# Patient Record
Sex: Male | Born: 1975 | Race: White | Hispanic: No | Marital: Married | Smoking: Former smoker
Health system: Southern US, Community
[De-identification: ages and names within clinical notes are randomized; demographics above are authoritative.]

## PROBLEM LIST (undated history)

## (undated) DIAGNOSIS — K219 Gastro-esophageal reflux disease without esophagitis: Secondary | ICD-10-CM

## (undated) DIAGNOSIS — K469 Unspecified abdominal hernia without obstruction or gangrene: Secondary | ICD-10-CM

## (undated) HISTORY — PX: HERNIA REPAIR: SHX51

## (undated) HISTORY — PX: WISDOM TOOTH EXTRACTION: SHX21

---

## 2013-05-04 ENCOUNTER — Encounter (HOSPITAL_BASED_OUTPATIENT_CLINIC_OR_DEPARTMENT_OTHER): Payer: Self-pay

## 2013-05-04 ENCOUNTER — Emergency Department (HOSPITAL_BASED_OUTPATIENT_CLINIC_OR_DEPARTMENT_OTHER)
Admission: EM | Admit: 2013-05-04 | Discharge: 2013-05-04 | Disposition: A | Discharge status: Home / Self Care | Payer: 59 | Attending: Emergency Medicine | Admitting: Emergency Medicine

## 2013-05-04 DIAGNOSIS — Y9389 Activity, other specified: Secondary | ICD-10-CM | POA: Insufficient documentation

## 2013-05-04 DIAGNOSIS — T189XXA Foreign body of alimentary tract, part unspecified, initial encounter: Secondary | ICD-10-CM | POA: Insufficient documentation

## 2013-05-04 DIAGNOSIS — Z8719 Personal history of other diseases of the digestive system: Secondary | ICD-10-CM | POA: Insufficient documentation

## 2013-05-04 DIAGNOSIS — IMO0002 Reserved for concepts with insufficient information to code with codable children: Secondary | ICD-10-CM | POA: Insufficient documentation

## 2013-05-04 DIAGNOSIS — Y9289 Other specified places as the place of occurrence of the external cause: Secondary | ICD-10-CM | POA: Insufficient documentation

## 2013-05-04 DIAGNOSIS — R112 Nausea with vomiting, unspecified: Secondary | ICD-10-CM | POA: Insufficient documentation

## 2013-05-04 HISTORY — DX: Unspecified abdominal hernia without obstruction or gangrene: K46.9

## 2013-05-04 NOTE — ED Notes (Signed)
Pt states he is having abd "discomfort-feel like something moving around in there"-since eating a sandwich from work that had plastic in it at 745am

## 2013-05-04 NOTE — ED Provider Notes (Signed)
History     CSN: [id number]  Arrival date & time 05/04/13  2046   First MD Initiated Contact with Patient 05/04/13 2304      Chief Complaint  Patient presents with  . Abdominal Pain    (Consider location/radiation/quality/duration/timing/severity/associated sxs/prior treatment) HPI This is a 37 year old male who ate a bacon biscuit for breakfast this morning. It had some plastic adherent to the bacon, and he estimates he swallowed a sliver about 1-1/2 inches long according to light. He states he was thicker than plastic wrap. This evening after dinner he developed nausea and retching. There was also the sensation that "something was moving around" in his epigastric region;   The symptoms were mild. There was no associated abdominal distention, diarrhea or change in his chronic constipation. He is asymptomatic at this time.   Past Medical History  Diagnosis Date  . Hernia     Past Surgical History  Procedure Laterality Date  . Hernia repair      No family history on file.  History  Substance Use Topics  . Smoking status: Never Smoker   . Smokeless tobacco: Not on file  . Alcohol Use: Yes      Review of Systems  All other systems reviewed and are negative.    Allergies  Sulfa antibiotics  Home Medications   Current Outpatient Rx  Name  Route  Sig  Dispense  Refill  . PREDNISONE PO   Oral   Take by mouth.           BP 144/89  Pulse 70  Temp(Src) 98.2 F (36.8 C) (Oral)  Resp 20  Ht 6\' 1"  (1.854 m)  Wt 188 lb (85.276 kg)  BMI 24.81 kg/m2  SpO2 98%  Physical Exam General: Well-developed, well-nourished male in no acute distress; appearance consistent with age of record HENT: normocephalic, atraumatic Eyes: pupils equal round and reactive to light; extraocular muscles intact Neck: supple Heart: regular rate and rhythm Lungs: clear to auscultation bilaterally Abdomen: soft; nondistended; nontender; no masses or hepatosplenomegaly; bowel sounds  present Extremities: No deformity; full range of motion Neurologic: Awake, alert and oriented; motor function intact in all extremities and symmetric; no facial droop Skin: Warm and dry Psychiatric: Normal mood and affect    ED Course  Procedures (including critical care time)    MDM  Patient was reassured that what he swallowed is unlikely to cause any serious difficulty. He was advised to return for severe pain, intractable nausea and vomiting, fever or rigid abdomen.        Hanley Seamen, MD 05/04/13 2320

## 2013-05-04 NOTE — ED Notes (Signed)
MD at bedside. 

## 2015-10-23 ENCOUNTER — Emergency Department (HOSPITAL_BASED_OUTPATIENT_CLINIC_OR_DEPARTMENT_OTHER)
Admission: EM | Admit: 2015-10-23 | Discharge: 2015-10-23 | Disposition: A | Discharge status: Home / Self Care | Payer: 59 | Attending: Emergency Medicine | Admitting: Emergency Medicine

## 2015-10-23 ENCOUNTER — Encounter (HOSPITAL_BASED_OUTPATIENT_CLINIC_OR_DEPARTMENT_OTHER): Payer: Self-pay | Admitting: *Deleted

## 2015-10-23 ENCOUNTER — Other Ambulatory Visit: Payer: Self-pay

## 2015-10-23 DIAGNOSIS — R002 Palpitations: Secondary | ICD-10-CM | POA: Diagnosis not present

## 2015-10-23 DIAGNOSIS — R079 Chest pain, unspecified: Secondary | ICD-10-CM | POA: Insufficient documentation

## 2015-10-23 DIAGNOSIS — R000 Tachycardia, unspecified: Secondary | ICD-10-CM | POA: Diagnosis present

## 2015-10-23 DIAGNOSIS — Z87891 Personal history of nicotine dependence: Secondary | ICD-10-CM | POA: Insufficient documentation

## 2015-10-23 DIAGNOSIS — K219 Gastro-esophageal reflux disease without esophagitis: Secondary | ICD-10-CM | POA: Insufficient documentation

## 2015-10-23 DIAGNOSIS — Z79899 Other long term (current) drug therapy: Secondary | ICD-10-CM | POA: Insufficient documentation

## 2015-10-23 DIAGNOSIS — L03113 Cellulitis of right upper limb: Secondary | ICD-10-CM | POA: Insufficient documentation

## 2015-10-23 DIAGNOSIS — R42 Dizziness and giddiness: Secondary | ICD-10-CM | POA: Diagnosis not present

## 2015-10-23 HISTORY — DX: Gastro-esophageal reflux disease without esophagitis: K21.9

## 2015-10-23 LAB — COMPREHENSIVE METABOLIC PANEL
ALBUMIN: 3.7 g/dL (ref 3.5–5.0)
ALK PHOS: 42 U/L (ref 38–126)
ALT: 16 U/L — AB (ref 17–63)
AST: 19 U/L (ref 15–41)
Anion gap: 6 (ref 5–15)
BILIRUBIN TOTAL: 0.8 mg/dL (ref 0.3–1.2)
BUN: 17 mg/dL (ref 6–20)
CALCIUM: 8.9 mg/dL (ref 8.9–10.3)
CO2: 26 mmol/L (ref 22–32)
CREATININE: 0.93 mg/dL (ref 0.61–1.24)
Chloride: 107 mmol/L (ref 101–111)
GFR calc Af Amer: >60 mL/min (ref >60)
GFR calc non Af Amer: >60 mL/min (ref >60)
GLUCOSE: 90 mg/dL (ref 65–99)
POTASSIUM: 3.8 mmol/L (ref 3.5–5.1)
Sodium: 139 mmol/L (ref 135–145)
TOTAL PROTEIN: 6.8 g/dL (ref 6.5–8.1)

## 2015-10-23 LAB — CBC WITH DIFFERENTIAL/PLATELET
BASOS ABS: 0 10*3/uL (ref 0.0–0.1)
BASOS PCT: 0 %
Eosinophils Absolute: 0.2 10*3/uL (ref 0.0–0.7)
Eosinophils Relative: 3 %
HEMATOCRIT: 39.1 % (ref 39.0–52.0)
HEMOGLOBIN: 14.1 g/dL (ref 13.0–17.0)
LYMPHS PCT: 14 %
Lymphs Abs: 1 10*3/uL (ref 0.7–4.0)
MCH: 29.4 pg (ref 26.0–34.0)
MCHC: 36.1 g/dL — ABNORMAL HIGH (ref 30.0–36.0)
MCV: 81.6 fL (ref 78.0–100.0)
Monocytes Absolute: 0.8 10*3/uL (ref 0.1–1.0)
Monocytes Relative: 11 %
NEUTROS ABS: 5.3 10*3/uL (ref 1.7–7.7)
NEUTROS PCT: 72 %
Platelets: 190 10*3/uL (ref 150–400)
RBC: 4.79 MIL/uL (ref 4.22–5.81)
RDW: 12.4 % (ref 11.5–15.5)
WBC: 7.4 10*3/uL (ref 4.0–10.5)

## 2015-10-23 LAB — TSH: TSH: 0.652 u[IU]/mL (ref 0.350–4.500)

## 2015-10-23 MED ORDER — SODIUM CHLORIDE 0.9 % IV BOLUS (SEPSIS)
1000.0000 mL | Freq: Once | INTRAVENOUS | Status: AC
  Administered 2015-10-23: 1000 mL via INTRAVENOUS

## 2015-10-23 MED ORDER — CLINDAMYCIN HCL 150 MG PO CAPS: 300.0000 mg | ORAL_CAPSULE | Freq: Four times a day (QID) | ORAL | Status: DC

## 2015-10-23 MED ORDER — VANCOMYCIN HCL IN DEXTROSE 1-5 GM/200ML-% IV SOLN
1000.0000 mg | Freq: Once | INTRAVENOUS | Status: AC
  Administered 2015-10-23: 1000 mg via INTRAVENOUS
  Filled 2015-10-23: qty 200

## 2015-10-23 NOTE — ED Notes (Signed)
Patient states he developed intermittent rapid heart rate approximately two weeks ago with a high rate of 95.  States one week later, he traveled to [place] and while there, he began to have more episodes of the rapid heart rate.  For the last four days, the symptoms have been worse and are associated with chest pain with radiation into left shoulder and sob.  This morning he has had seven or eight runs of increased heart rate with a high of 90.  Was seen at Urgent Care and told he had a normal ekg.  Additionally, three days ago, he returned from [place] and eight hours later he developed redness and swelling in his right elbow which has progressively gotten worse.

## 2015-10-23 NOTE — ED Notes (Signed)
MD at bedside. 

## 2015-10-23 NOTE — ED Notes (Signed)
Pt reports HR went into 80's and he started feeling dizzy, informed MD.  Pt appears in NAD, on cell phone with wife at bedside

## 2015-10-23 NOTE — ED Provider Notes (Signed)
CSN: [id number]     Arrival date & time 10/23/15  0901 History   First MD Initiated Contact with Patient 10/23/15 0912     Chief Complaint  Patient presents with  . Tachycardia     (Consider location/radiation/quality/duration/timing/severity/associated sxs/prior Treatment) Patient is a 39 y.o. male presenting with palpitations.  Palpitations Palpitations quality:  Regular Onset quality:  Sudden Duration:  30 seconds Timing:  Intermittent Chronicity:  New Context: not anxiety, not blood loss, not caffeine, not dehydration, not exercise, not illicit drugs, not nicotine and not stimulant use   Relieved by:  None tried Worsened by:  Nothing Ineffective treatments:  None tried Associated symptoms: chest pain (once last friday)   Associated symptoms: no back pain, no leg pain and no nausea     Past Medical History  Diagnosis Date  . Hernia   . Acid reflux    Past Surgical History  Procedure Laterality Date  . Hernia repair     No family history on file. Social History  Substance Use Topics  . Smoking status: Former Smoker    Quit date: 12/07/2013  . Smokeless tobacco: Never Used  . Alcohol Use: Yes     Comment: occassionally    Review of Systems  Constitutional: Negative for fever and chills.  Cardiovascular: Positive for chest pain (once last friday) and palpitations (intermittent).  Gastrointestinal: Negative for nausea.  Genitourinary: Negative for urgency and hematuria.  Musculoskeletal: Negative for myalgias and back pain.  Skin: Positive for rash (right arm around elbow and on forearm).  Psychiatric/Behavioral: Negative for agitation.  All other systems reviewed and are negative.     Allergies  Sulfa antibiotics  Home Medications   Prior to Admission medications   Medication Sig Start Date End Date Taking? Authorizing Provider  DOXYCYCLINE PO Take by mouth. Started on 10/21/15 for elbow infection.   Yes Historical Provider, MD  esomeprazole (NEXIUM)  10 MG packet Take 10 mg by mouth daily before breakfast.   Yes Historical Provider, MD  clindamycin (CLEOCIN) 150 MG capsule Take 2 capsules (300 mg total) by mouth every 6 (six) hours. 10/23/15   Merrily Pew, MD  PREDNISONE PO Take by mouth.    Historical Provider, MD   BP 111/74 mmHg  Pulse 64  Temp(Src) 97.7 F (36.5 C) (Oral)  Resp 16  SpO2 100% Physical Exam  Constitutional: He is oriented to person, place, and time. He appears well-developed and well-nourished.  HENT:  Head: Normocephalic and atraumatic.  Eyes: Conjunctivae are normal. Pupils are equal, round, and reactive to light.  Neck: Normal range of motion.  Cardiovascular: Normal rate and regular rhythm.  Exam reveals no gallop and no friction rub.   No murmur heard. Pulmonary/Chest: Effort normal. No respiratory distress. He has no wheezes.  Abdominal: Soft. Bowel sounds are normal. He exhibits no distension.  Musculoskeletal: Normal range of motion.  Neurological: He is alert and oriented to person, place, and time.  Nursing note and vitals reviewed.   ED Course  Procedures (including critical care time) Labs Review Labs Reviewed  CBC WITH DIFFERENTIAL/PLATELET - Abnormal; Notable for the following:    MCHC 36.1 (*)    All other components within normal limits  COMPREHENSIVE METABOLIC PANEL - Abnormal; Notable for the following:    ALT 16 (*)    All other components within normal limits  TSH    Imaging Review No results found. I have personally reviewed and evaluated these images and lab results as part of my medical  decision-making.   EKG Interpretation   Date/Time:  Wednesday October 23 2015 09:08:06 EST Ventricular Rate:  68 PR Interval:  156 QRS Duration: 92 QT Interval:  396 QTC Calculation: 421 R Axis:   -15 Text Interpretation:  Normal sinus rhythm Minimal voltage criteria for  LVH, may be normal variant Borderline ECG Confirmed by Sahalie Beth MD, Corene Cornea  [phone]) on 10/23/2015 9:10:58 AM       MDM   Final diagnoses:  Palpitations  Cellulitis of right upper extremity   Intermittent palpitations of uncertain etiology associated with some lightheadedness. Also has cellulitis of his right arm. Monitored in the emergency department for prolonged period time at one episode of dizziness when his heart rate at 80 but there is no obvious dysrhythmias or tachyarrhythmias at that time. As her to cut out caffeine he will work on identifying other possible associated causes. Could also be related to anxiety to a certain extent. Switch antibiotics from doxycycline to clindamycin to better cover non-MRSA bacteria. Follow-up with primary doctor for possible Holter monitor or other workup. Will return here for any new or worsening symptoms.      Merrily Pew, MD 10/24/15 [phone]

## 2015-10-23 NOTE — Discharge Instructions (Signed)
°Emergency Department Resource Guide °1) Find a Doctor and Pay Out of Pocket °Although you won't have to find out who is covered by your insurance plan, it is a good idea to ask around and get recommendations. You will then need to call the office and see if the doctor you have chosen will accept you as a new patient and what types of options they offer for patients who are self-pay. Some doctors offer discounts or will set up payment plans for their patients who do not have insurance, but you will need to ask so you aren't surprised when you get to your appointment. ° °2) Contact Your Local Health Department °Not all health departments have doctors that can see patients for sick visits, but many do, so it is worth a call to see if yours does. If you don't know where your local health department is, you can check in your phone book. The CDC also has a tool to help you locate your state's health department, and many state websites also have listings of all of their local health departments. ° °3) Find a Walk-in Clinic °If your illness is not likely to be very severe or complicated, you may want to try a walk in clinic. These are popping up all over the country in pharmacies, drugstores, and shopping centers. They're usually staffed by nurse practitioners or physician assistants that have been trained to treat common illnesses and complaints. They're usually fairly quick and inexpensive. However, if you have serious medical issues or chronic medical problems, these are probably not your best option. ° °No Primary Care Doctor: °- Call [facility] at  832-8000 - they can help you locate a primary care doctor that  accepts your insurance, provides certain services, etc. °- Physician Referral Service- 1-800-533-3463 ° °Chronic Pain Problems: °Organization         Address  Phone   Notes  °[facility]  (336) 297-2271 Patients need to be referred by their primary care doctor.  ° °Medication  Assistance: °Organization         Address  Phone   Notes  °[place] [facility] [address] °[place], [place] [address] (336) 641-8030 --Must be a resident of [place] °-- Must have NO insurance coverage whatsoever (no Medicaid/ Medicare, etc.) °-- The pt. MUST have a primary care doctor that directs their care regularly and follows them in the community °  °[facility]  (866) 331-1348   °[employer]  (888) 892-1162   ° °Agencies that provide inexpensive medical care: °Organization         Address  Phone   Notes  °[facility]  (336) 832-8035   °[facility]    (336) 832-7272   °[facility] [address] °[place], [place] [address] (336) 832-4777   °[facility] [address], °[place] (336) 271-4999   °[facility]    (336) 373-0678   °[facility]    (336) 272-1050   °[facility] ° [address], [place] Phone:  (336) 832-4444, Fax:  (336) 832-4440 Hours of Operation:  9 am - 6 pm, M-F.  Also accepts Medicaid/Medicare and self-pay.  °[facility] ° [address], [place] Phone: (336) 832-3150, Fax: (336) 832-3151. Hours of Operation:  8:30 am - 5:30 pm, M-F.  Also accepts Medicaid and self-pay.  °[facility] [place] [address], [place] Phone: (336) 878-6027   °[facility] [address], [place], [place] (336)723-1848, Ext. 123 Mondays & Thursdays: 7-9 AM.  First 15 patients are seen on a first come, first serve basis. °  ° °Medicaid-accepting [place] Providers: ° °Organization         Address  Phone   Notes  °[facility] [address], [place] (336) 641-2100 Also accepts self-pay patients.  °[facility] [address], [place] ° (336) 856-9996   °[facility] [address], [place]  (336) 288-8857   °[facility] [address] [place] [address], [place] (336) 299-7000   °Veita Bland [address], [place]  ° (336) 373-1557 Only accepts [place] Access Medicaid patients after they have their name applied to their card.  ° °Self-Pay (no insurance) in [place]: ° °Organization         Address  Phone   Notes  °Sickle Cell Patients, [facility] [address], [place] (336) 832-1970   °[facility] [address], [place] (336) 832-4400   °[facility] ° [address], [place] (336) 992-4800   °[facility]/Dr. Osei-Bonsu ° [address] [place] [address], [place] or [address], [place] (336) 841-8500 Phone number for both [place] and [place] locations is the same.  °[facility] [address], [place] (336) 299-0000   °[facility] [address] [place] [address], [place] or [address] (336) 852-7530 °(336) 878-2260   °[facility] [address], [place] (336) 350-1642, phone; (336) 294-5005, fax Sees patients 1st and 3rd Saturday of every month.  Must not qualify for public or private insurance (i.e. Medicaid, Medicare, [place] Health Choice, Veterans' Benefits) • Household income should be no more than 200% of the poverty level •The clinic cannot treat you if you are pregnant or think you are pregnant • Sexually transmitted diseases are not treated at the clinic.  ° ° °Dental Care: °Organization         Address  Phone  Notes  °[place] [facility] [address], [place] (336) 641-6152 Accepts children up to age 21 who are enrolled in Medicaid or [place] Health Choice; pregnant women with a Medicaid card; and children who have applied for Medicaid or [place] Health Choice, but were declined, whose parents can pay a reduced fee at time of service.  °[place]  [facility] [place]  [address], [place] (336) 641-7733 Accepts children up to age 21 who are enrolled in Medicaid or [place] Health Choice; pregnant women with a Medicaid card; and children who have applied for Medicaid or [place] Health Choice, but were declined, whose parents can pay a reduced fee at time of service.  °[facility] ° [address], [place] (336) 641-4533 Patients are seen by appointment only. Walk-ins are not accepted. [facility] will see patients 18 years of age and older. °Monday - Tuesday (8am-5pm) °Most Wednesdays (8:30-5pm) °$30 per visit, cash only  °[facility] ° [address], [place] (336) 641-4533 Patients are seen by appointment only. Walk-ins are not accepted. [facility] will see patients 18 years of age and older. °One   Wednesday Evening (Monthly: Volunteer Based).  $30 per visit, cash only  °[facility]  (919) 537-3737 for adults; Children under age 4, call Graduate Pediatric Dentistry at (919) [phone]. Children aged 4-14, please call (919) [phone] to request a pediatric application. ° Dental services are provided in all areas of dental care including fillings, crowns and bridges, complete and partial dentures, implants, gum treatment, root canals, and extractions. Preventive care is also provided. Treatment is provided to both adults and children. °Patients are selected via a lottery and there is often a waiting list. °  °[facility] [address], °[place] ° (336) 763-8833 [email or web address] °  °[facility] [address], [place], [place] (336)723-1848, Ext. 123 Second and Fourth Thursday of each month, opens at 6:30 AM; Clinic ends at 9 AM.  Patients are seen on a first-come first-served basis, and a limited number are seen during each clinic.  ° °[facility] ° [address], [place], [place] (336) 723-7904    Eligibility Requirements °You must have lived in [place], [place], or [place] counties for at least the last three months. °  You cannot be eligible for state or federal sponsored healthcare insurance, including Veterans Administration, Medicaid, or Medicare. °  You generally cannot be eligible for healthcare insurance through your employer.  °  How to apply: °Eligibility screenings are held every Tuesday and Wednesday afternoon from 1:00 pm until 4:00 pm. You do not need an appointment for the interview!  °[facility] [address], [place], [place] 336-631-2330   °[facility]  336-342-8273   °[facility]  336-703-3100   °[facility]  336-570-6415   ° °Behavioral Health Resources in the Community: °Intensive Outpatient Programs °Organization         Address  Phone  Notes  °[facility] [address], [place], [place] 336-878-6098   °[facility] [address], [place], [place] 336-832-9800   °[facility] [address], [place], [place] ° 336-882-2125   °[facility] [address],  °[place], [place] 1-800-853-5163 or 336-641-4981   °Substance Abuse Resources °Organization         Address  Phone  Notes  °[facility]  336-882-2125   °[facility]  336-784-9470   °[facility]  336-285-9073   °[facility]  336-845-3988   °Residential & Outpatient Substance Abuse Program  1-800-659-3381   °Psychological Services °Organization         Address  Phone  Notes  ° Health  336- 832-9600   °[facility]  336- 378-7881   °[facility] [address], [place] 1-800-853-5163 or 336-641-4981   ° °Mobile Crisis Teams °Organization         Address  Phone  Notes  °[facility], Mobile Crisis Care Unit  1-877-626-1772   °Assertive °[facility] ° [address]  [place], [place] 336-834-9664   °Sharon DeEsch [address] °[place] 336-554-5454   ° °Self-Help/Support Groups °Organization         Address  Phone             Notes  °[facility]  - variety of support groups  336- 373-1402 Call for more information  °Narcotics Anonymous (NA), [facility] [address], °[place]  2 meetings at this location  ° °  Residential Treatment Programs °Organization         Address  Phone  Notes  °[facility] [address],    °[place]  1-866-801-8205   °[facility] ° [address], [place], [place] 704-293-8524   °[facility] [address], [place] 336-845-3988 Admissions: 8am-3pm M-F  °[facility] [address]    °[place], [place] 336-841-1104   °[facility] [address], [place], [place] 336-379-7146   °[facility] [address]  °[place], [place] 336-285-9073   °[facility] Intensive Outpatient [address], [place], [place] 336-852-3033   °[facility] [address]  °[place], [place] 1-877-615-2722 or 336-784-9470   °[facility] [address] [place], [place] 336-227-7417 Accepts Medicaid  °[facility] [address]  °[place] 1-800-659-3381 Substance Abuse/Addiction Treatment  ° °[place] Behavioral Health Resources °Organization         Address  Phone  Notes  °[facility]  (888) 581-9988   °Julie Brannon, PhD [address] [place], [place]   (336) 349-5553 or (336) 951-0000   °[facility]   [address] °[place], [place] (336) 349-4454   °[facility] [address], [place], [place] (336) 342-8316 Insurance/Medicaid/sponsorship through [facility]  °[facility] [address]                                    [place], [place] (336) 342-8316 Therapy/tele-psych/case    °[facility] [address]  ° [place], [place] (336) 349-2233    °Dr. Arfeen  (336) 349-4544   °[facility] [place]  [employer] [place] [facility] 1) [address], [place] °2) [address], [place] °3)  [address], [place] (336) 349-3220 °(336) 342-7768 ° °(336) 342-8140   °[place] Child Abuse [facility] (336) 342-1394 or (336) 342-3537 (After Hours)    ° ° °

## 2016-06-26 ENCOUNTER — Emergency Department (HOSPITAL_BASED_OUTPATIENT_CLINIC_OR_DEPARTMENT_OTHER): Payer: 59

## 2016-06-26 ENCOUNTER — Encounter (HOSPITAL_BASED_OUTPATIENT_CLINIC_OR_DEPARTMENT_OTHER): Payer: Self-pay | Admitting: *Deleted

## 2016-06-26 ENCOUNTER — Emergency Department (HOSPITAL_BASED_OUTPATIENT_CLINIC_OR_DEPARTMENT_OTHER)
Admission: EM | Admit: 2016-06-26 | Discharge: 2016-06-26 | Disposition: A | Discharge status: Home / Self Care | Payer: 59 | Attending: Emergency Medicine | Admitting: Emergency Medicine

## 2016-06-26 DIAGNOSIS — R0789 Other chest pain: Secondary | ICD-10-CM | POA: Insufficient documentation

## 2016-06-26 DIAGNOSIS — F17200 Nicotine dependence, unspecified, uncomplicated: Secondary | ICD-10-CM | POA: Insufficient documentation

## 2016-06-26 DIAGNOSIS — M25512 Pain in left shoulder: Secondary | ICD-10-CM | POA: Insufficient documentation

## 2016-06-26 LAB — BASIC METABOLIC PANEL
ANION GAP: 9 (ref 5–15)
BUN: 13 mg/dL (ref 6–20)
CHLORIDE: 105 mmol/L (ref 101–111)
CO2: 23 mmol/L (ref 22–32)
Calcium: 9.1 mg/dL (ref 8.9–10.3)
Creatinine, Ser: 0.97 mg/dL (ref 0.61–1.24)
GFR calc non Af Amer: >60 mL/min (ref >60)
GLUCOSE: 119 mg/dL — AB (ref 65–99)
Potassium: 3.5 mmol/L (ref 3.5–5.1)
Sodium: 137 mmol/L (ref 135–145)

## 2016-06-26 LAB — CBC WITH DIFFERENTIAL/PLATELET
BASOS PCT: 1 %
Basophils Absolute: 0.1 10*3/uL (ref 0.0–0.1)
Eosinophils Absolute: 0.2 10*3/uL (ref 0.0–0.7)
Eosinophils Relative: 3 %
HEMATOCRIT: 42.1 % (ref 39.0–52.0)
HEMOGLOBIN: 15.5 g/dL (ref 13.0–17.0)
LYMPHS PCT: 27 %
Lymphs Abs: 1.6 10*3/uL (ref 0.7–4.0)
MCH: 30.2 pg (ref 26.0–34.0)
MCHC: 36.8 g/dL — AB (ref 30.0–36.0)
MCV: 81.9 fL (ref 78.0–100.0)
MONO ABS: 0.6 10*3/uL (ref 0.1–1.0)
MONOS PCT: 10 %
NEUTROS ABS: 3.7 10*3/uL (ref 1.7–7.7)
NEUTROS PCT: 60 %
Platelets: 215 10*3/uL (ref 150–400)
RBC: 5.14 MIL/uL (ref 4.22–5.81)
RDW: 12.8 % (ref 11.5–15.5)
WBC: 6.2 10*3/uL (ref 4.0–10.5)

## 2016-06-26 LAB — TROPONIN I: Troponin I: <0.03 ng/mL (ref <0.03)

## 2016-06-26 NOTE — ED Provider Notes (Signed)
CSN: [id number]     Arrival date & time 06/26/16  1420 History   First MD Initiated Contact with Patient 06/26/16 1437     Chief Complaint  Patient presents with  . Chest Pain     (Consider location/radiation/quality/duration/timing/severity/associated sxs/prior Treatment) Patient is a 40 y.o. male presenting with chest pain. The history is provided by the patient.  Chest Pain Pain location:  L lateral chest Pain quality: aching   Pain radiates to:  Does not radiate Pain radiates to the back: no   Pain severity:  Moderate Onset quality:  Gradual Duration:  2 days Timing:  Intermittent Progression:  Waxing and waning Chronicity:  New Context: breathing   Context comment:  With exertion Relieved by:  Nothing Worsened by:  Nothing tried Ineffective treatments:  None tried Associated symptoms: anxiety and fatigue   Risk factors: smoking   Risk factors: no coronary artery disease, no diabetes mellitus, no high cholesterol, no hypertension and not obese     Past Medical History  Diagnosis Date  . Hernia   . Acid reflux    Past Surgical History  Procedure Laterality Date  . Hernia repair     History reviewed. No pertinent family history. Social History  Substance Use Topics  . Smoking status: Current Some Day Smoker    Last Attempt to Quit: 12/07/2013  . Smokeless tobacco: Never Used  . Alcohol Use: Yes     Comment: occassionally    Review of Systems  Constitutional: Positive for fatigue.  Cardiovascular: Positive for chest pain.  All other systems reviewed and are negative.     Allergies  Sulfa antibiotics  Home Medications   Prior to Admission medications   Medication Sig Start Date End Date Taking? Authorizing Provider  esomeprazole (NEXIUM) 10 MG packet Take 10 mg by mouth daily before breakfast.    Historical Provider, MD   BP 146/82 mmHg  Pulse 63  Temp(Src) 97.7 F (36.5 C) (Oral)  Resp 18  Ht 6' 1"  (1.854 m)  Wt 200 lb (90.719 kg)  BMI 26.39  kg/m2  SpO2 100% Physical Exam  Constitutional: He is oriented to person, place, and time. He appears well-developed and well-nourished. No distress.  HENT:  Head: Normocephalic and atraumatic.  Eyes: Conjunctivae are normal.  Neck: Neck supple. No tracheal deviation present.  Cardiovascular: Normal rate, regular rhythm and normal heart sounds.   Pulmonary/Chest: Effort normal and breath sounds normal. No respiratory distress. He has no wheezes. He has no rales.  Abdominal: Soft. He exhibits no distension.  Neurological: He is alert and oriented to person, place, and time.  Skin: Skin is warm and dry.  Psychiatric: He has a normal mood and affect.  Vitals reviewed.   ED Course  Procedures (including critical care time) Labs Review Labs Reviewed  CBC WITH DIFFERENTIAL/PLATELET - Abnormal; Notable for the following:    MCHC 36.8 (*)    All other components within normal limits  BASIC METABOLIC PANEL - Abnormal; Notable for the following:    Glucose, Bld 119 (*)    All other components within normal limits  TROPONIN I    Imaging Review Dg Chest 2 View  06/26/2016  CLINICAL DATA:  Left chest and shoulder pain. EXAM: CHEST  2 VIEW COMPARISON:  None available FINDINGS: The heart size and mediastinal contours are within normal limits. Both lungs are clear. The visualized skeletal structures are unremarkable. IMPRESSION: No active cardiopulmonary disease. Electronically Signed   By: Jerilynn Mages.  Shick M.D.  On: 06/26/2016 15:10   I have personally reviewed and evaluated these images and lab results as part of my medical decision-making.   EKG Interpretation   Date/Time:  Friday June 26 2016 14:40:56 EDT Ventricular Rate:  60 PR Interval:  160 QRS Duration: 97 QT Interval:  409 QTC Calculation: 409 R Axis:   72 Text Interpretation:  Sinus arrhythmia No significant change since last  tracing Confirmed by Britt Theard MD, Quillian Quince ([id number]) on 06/26/2016 2:47:58 PM      MDM   Final diagnoses:   Atypical chest pain  Left shoulder pain    40 year old male presents with left superior upper shoulder pain that he is concerned for heart attack symptoms. He has had some shortness of breath with activity and radiation down the left arm. No chest pain. He has had some tightness in his chest with breathing on exertion and has been anxious since the onset 2 days ago. He states the symptoms worsened in the last few hours. EKG is unremarkable. Heart score is 3. Troponin is negative despite ongoing pain. Doubt ACS. No signs or symptoms of PE. PERC negative. Plan to follow up with PCP as needed and return precautions discussed for worsening or new concerning symptoms.     Leo Grosser, MD 06/26/16 [phone]

## 2016-06-26 NOTE — ED Notes (Addendum)
Patient complaining of CP since yesterday morning, increasing this morning. States tightness with breathing and pain that radiates down left arm. Denies personal or immediate family history of cardiac issues. Complaining of nausea without vomiting. Complaining of lightheadedness and dizziness since lunch yesterday.

## 2016-06-26 NOTE — ED Notes (Signed)
Pt with chest pain radiating to left shoulder acompanied by Nausea SOB diaphoresis and lightheaded. Sx intermittent until thi pm x 2 hours ago when they became constant

## 2016-06-26 NOTE — ED Notes (Signed)
Pt transported to XR.  

## 2016-06-26 NOTE — ED Notes (Signed)
Pt returned from XR at this time.

## 2016-06-26 NOTE — Discharge Instructions (Signed)
Nonspecific Chest Pain  °Chest pain can be caused by many different conditions. There is always a chance that your pain could be related to something serious, such as a heart attack or a blood clot in your lungs. Chest pain can also be caused by conditions that are not life-threatening. If you have chest pain, it is very important to follow up with your health care provider. °CAUSES  °Chest pain can be caused by: °· Heartburn. °· Pneumonia or bronchitis. °· Anxiety or stress. °· Inflammation around your heart (pericarditis) or lung (pleuritis or pleurisy). °· A blood clot in your lung. °· A collapsed lung (pneumothorax). It can develop suddenly on its own (spontaneous pneumothorax) or from trauma to the chest. °· Shingles infection (varicella-zoster virus). °· Heart attack. °· Damage to the bones, muscles, and cartilage that make up your chest wall. This can include: °¨ Bruised bones due to injury. °¨ Strained muscles or cartilage due to frequent or repeated coughing or overwork. °¨ Fracture to one or more ribs. °¨ Sore cartilage due to inflammation (costochondritis). °RISK FACTORS  °Risk factors for chest pain may include: °· Activities that increase your risk for trauma or injury to your chest. °· Respiratory infections or conditions that cause frequent coughing. °· Medical conditions or overeating that can cause heartburn. °· Heart disease or family history of heart disease. °· Conditions or health behaviors that increase your risk of developing a blood clot. °· Having had chicken pox (varicella zoster). °SIGNS AND SYMPTOMS °Chest pain can feel like: °· Burning or tingling on the surface of your chest or deep in your chest. °· Crushing, pressure, aching, or squeezing pain. °· Dull or sharp pain that is worse when you move, cough, or take a deep breath. °· Pain that is also felt in your back, neck, shoulder, or arm, or pain that spreads to any of these areas. °Your chest pain may come and go, or it may stay  constant. °DIAGNOSIS °Lab tests or other studies may be needed to find the cause of your pain. Your health care provider may have you take a test called an ambulatory ECG (electrocardiogram). An ECG records your heartbeat patterns at the time the test is performed. You may also have other tests, such as: °· Transthoracic echocardiogram (TTE). During echocardiography, sound waves are used to create a picture of all of the heart structures and to look at how blood flows through your heart. °· Transesophageal echocardiogram (TEE). This is a more advanced imaging test that obtains images from inside your body. It allows your health care provider to see your heart in finer detail. °· Cardiac monitoring. This allows your health care provider to monitor your heart rate and rhythm in real time. °· Holter monitor. This is a portable device that records your heartbeat and can help to diagnose abnormal heartbeats. It allows your health care provider to track your heart activity for several days, if needed. °· Stress tests. These can be done through exercise or by taking medicine that makes your heart beat more quickly. °· Blood tests. °· Imaging tests. °TREATMENT  °Your treatment depends on what is causing your chest pain. Treatment may include: °· Medicines. These may include: °¨ Acid blockers for heartburn. °¨ Anti-inflammatory medicine. °¨ Pain medicine for inflammatory conditions. °¨ Antibiotic medicine, if an infection is present. °¨ Medicines to dissolve blood clots. °¨ Medicines to treat coronary artery disease. °· Supportive care for conditions that do not require medicines. This may include: °¨ Resting. °¨ Applying heat   or cold packs to injured areas. °¨ Limiting activities until pain decreases. °HOME CARE INSTRUCTIONS °· If you were prescribed an antibiotic medicine, finish it all even if you start to feel better. °· Avoid any activities that bring on chest pain. °· Do not use any tobacco products, including  cigarettes, chewing tobacco, or electronic cigarettes. If you need help quitting, ask your health care provider. °· Do not drink alcohol. °· Take medicines only as directed by your health care provider. °· Keep all follow-up visits as directed by your health care provider. This is important. This includes any further testing if your chest pain does not go away. °· If heartburn is the cause for your chest pain, you may be told to keep your head raised (elevated) while sleeping. This reduces the chance that acid will go from your stomach into your esophagus. °· Make lifestyle changes as directed by your health care provider. These may include: °¨ Getting regular exercise. Ask your health care provider to suggest some activities that are safe for you. °¨ Eating a heart-healthy diet. A registered dietitian can help you to learn healthy eating options. °¨ Maintaining a healthy weight. °¨ Managing diabetes, if necessary. °¨ Reducing stress. °SEEK MEDICAL CARE IF: °· Your chest pain does not go away after treatment. °· You have a rash with blisters on your chest. °· You have a fever. °SEEK IMMEDIATE MEDICAL CARE IF:  °· Your chest pain is worse. °· You have an increasing cough, or you cough up blood. °· You have severe abdominal pain. °· You have severe weakness. °· You faint. °· You have chills. °· You have sudden, unexplained chest discomfort. °· You have sudden, unexplained discomfort in your arms, back, neck, or jaw. °· You have shortness of breath at any time. °· You suddenly start to sweat, or your skin gets clammy. °· You feel nauseous or you vomit. °· You suddenly feel light-headed or dizzy. °· Your heart begins to beat quickly, or it feels like it is skipping beats. °These symptoms may represent a serious problem that is an emergency. Do not wait to see if the symptoms will go away. Get medical help right away. Call your local emergency services (911 in the [place] Do not drive yourself to the hospital. °  °This  information is not intended to replace advice given to you by your health care provider. Make sure you discuss any questions you have with your health care provider. °  °Document Released: 09/02/2005 Document Revised: 12/14/2014 Document Reviewed: 06/29/2014 °Elsevier Interactive Patient Education ©2016 [employer] Inc[employer] ° °

## 2016-06-26 NOTE — ED Notes (Signed)
MD at bedside. 

## 2016-07-13 ENCOUNTER — Encounter: Payer: Self-pay | Admitting: Family Medicine

## 2016-07-13 ENCOUNTER — Ambulatory Visit (INDEPENDENT_AMBULATORY_CARE_PROVIDER_SITE_OTHER): Payer: 59 | Admitting: Family Medicine

## 2016-07-13 VITALS — BP 122/80 | HR 83 | Temp 98.0°F | Ht 73.0 in | Wt 203.0 lb

## 2016-07-13 DIAGNOSIS — Z7189 Other specified counseling: Secondary | ICD-10-CM

## 2016-07-13 DIAGNOSIS — K219 Gastro-esophageal reflux disease without esophagitis: Secondary | ICD-10-CM

## 2016-07-13 DIAGNOSIS — Z7689 Persons encountering health services in other specified circumstances: Secondary | ICD-10-CM

## 2016-07-13 NOTE — Progress Notes (Signed)
[place] at [facility] [address], [place], [place] [address] [phone]  Date:  07/13/2016   Name:  Rodney Harris   DOB:  10-01-1976   MRN:  [id number]  PCP:  No PCP Per Patient    Chief Complaint: Establish Care (Pt here to est.Pt was seen in the ER on 06/26/16. )   History of Present Illness:  Rodney Harris is a 40 y.o. very pleasant male patient who presents with the following:  He was seen in the Er on 7/21 with left shoulder pain- his cardiac eval was negative.   He had been a pt at [facility] but has decided to come and se [place] as they have too much turnover.   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   He is generally in good health. He has a history of intermittent GERD- he will use medication when he has sx and stop when sx resolve.  He has noted this for about 17 years.  He will use nexium 40 daily as needed   He did have a CPE in January- had his cholesterol checked then.  He thinks it looked fine.  He had non- fasting glucose at his last ER visit.    He does not have any other concerns today He enjoys cycling- he has a race coming up on Saturday, 100 miles.    He did quit smoking in July- he was just an occasional smoker.   No fever, chills, SOB, CP, nausea or vomiting  There are no active problems to display for this patient.   Past Medical History:  Diagnosis Date  . Acid reflux   . Hernia     Past Surgical History:  Procedure Laterality Date  . HERNIA REPAIR      Social History  Substance Use Topics  . Smoking status: Current Some Day Smoker    Last attempt to quit: 12/07/2013  . Smokeless tobacco: Never Used  . Alcohol use Yes     Comment: occassionally    No family history on file.  Allergies  Allergen Reactions  . Sulfa Antibiotics Hives    Medication list has been reviewed and  updated.  Current Outpatient Prescriptions on File Prior to Visit  Medication Sig Dispense Refill  . esomeprazole (NEXIUM) 10 MG packet Take 10 mg by mouth daily before breakfast.     No current facility-administered medications on file prior to visit.     Review of Systems:  As per HPI- otherwise negative.   Physical Examination: Vitals:   07/13/16 1057  BP: 122/80  Pulse: 83  Temp: 98 F (36.7 C)   Vitals:   07/13/16 1057  Weight: 203 lb (92.1 kg)  Height: 6' 1"  (1.854 m)   Body mass index is 26.78 kg/m. Ideal Body Weight: Weight in (lb) to have BMI = 25: 189.1  GEN: WDWN, NAD, Non-toxic, A & O x 3, looks well HEENT: Atraumatic, Normocephalic. Neck supple. No masses, No LAD.  Bilateral TM wnl, oropharynx normal.  PEERL,EOMI.   Ears and Nose: No external deformity. CV: RRR, No M/G/R. No JVD. No thrill. No extra heart sounds. PULM: CTA B, no wheezes, crackles, rhonchi. No retractions. No resp. distress. No accessory muscle use. EXTR: No c/c/e NEURO Normal gait.  PSYCH: Normally interactive. Conversant. Not depressed or anxious appearing.  Calm demeanor.    Assessment and Plan: Gastroesophageal reflux disease without esophagitis  Encounter to establish care  Here today to establish care.  GERD- he will continue to use OTC medication as needed Borderline glucose at ER visit was soon after eating so ok He will continue to exercise and avoid tobacco Plan for a CPE in January   Signed Lamar Blinks, MD

## 2016-07-13 NOTE — Patient Instructions (Signed)
It was nice to meet you today- let's plan for a physical in January of 2018.  Let me know if you have any concerns in the meantime

## 2016-07-13 NOTE — Progress Notes (Signed)
Pre visit review using our clinic review tool, if applicable. No additional management support is needed unless otherwise documented below in the visit note. 

## 2016-08-05 ENCOUNTER — Telehealth: Payer: Self-pay | Admitting: General Practice

## 2016-08-05 ENCOUNTER — Encounter: Payer: Self-pay | Admitting: Family Medicine

## 2016-08-05 NOTE — Telephone Encounter (Signed)
Relation to UV:JDYN Call back number:[phone] Pharmacy:  Reason for call:  vm advising patient new patient appointment was cancelled for 08/05/16 due to PCP establishing patient on 07/13/16. Please advise when PCP would like patient to follow up?

## 2016-08-05 NOTE — Telephone Encounter (Signed)
Sent pt a mychart- let's do a CPE in January

## 2016-08-06 ENCOUNTER — Ambulatory Visit: Payer: 59 | Admitting: Family Medicine

## 2016-08-06 NOTE — Telephone Encounter (Signed)
lvm advising patient to call back and schedule physical.

## 2016-12-19 DIAGNOSIS — J029 Acute pharyngitis, unspecified: Secondary | ICD-10-CM | POA: Diagnosis not present

## 2017-05-25 NOTE — Progress Notes (Signed)
[name] at [facility] [address], [place], [place] [address] [phone]  Date:  05/26/2017   Name:  Rodney Harris   DOB:  December 26, 1975   MRN:  [id number]  PCP:  Rodney Mclean, MD    Chief Complaint: Annual Exam (Pt here for CPE and is fasting for labs. )   History of Present Illness:  Rodney Harris is a 41 y.o. 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   He is generally in good health. He has a history of intermittent GERD- he will use medication when he has sx and stop when sx resolve.  He has noted this for about 17 years.  He will use nexium 40 daily as needed   He did have a CPE in January- had his cholesterol checked then.  He thinks it looked fine.  He had non- fasting glucose at his last ER visit.    He does not have any other concerns today He enjoys cycling- he has a race coming up on Saturday, 100 miles.    He did quit smoking in July- he was just an occasional smoker.   No fever, chills, SOB, CP, nausea or vomiting  Due for labs today He is using nexium for his GERD He is using this really every day now.  He will have worse sx if he eats after dinner.   He did have endoscopy in 2001.  Nothing more recently.   He does not have a GI doctor but would be willing to have an upper gi/ consultation  He has been cycling a lot.  No issues with CP or SOB when he is exercising  He is fasting this am  No history of prostate cancer in his family No concening moles.  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  Otherwise no genital concerns His daughters will be in 8th and K next year.  His oldest plays [employer] which keeps them quite busy!    BP Readings from Last 3 Encounters:  05/26/17 120/82  07/13/16 122/80  06/26/16 146/82     Patient Active Problem List   Diagnosis Date Noted  . GERD (gastroesophageal reflux disease) 07/13/2016    Past Medical History:  Diagnosis Date  . Acid reflux   . Hernia     Past Surgical History:  Procedure Laterality Date  . HERNIA REPAIR     1982  . HERNIA REPAIR     2011  . WISDOM TOOTH EXTRACTION      Social History  Substance Use Topics  . Smoking status: Former Smoker    Quit date: 12/07/2013  . Smokeless tobacco: Never Used  . Alcohol use Yes     Comment: occassionally    Family History  Problem Relation Age of Onset  . Breast cancer Mother   . Hypertension Father   . Arthritis Maternal Grandmother   . Hypertension Maternal Grandmother   . Arthritis Maternal Grandfather   . Lung cancer Maternal Grandfather   . Hypertension Maternal Grandfather   . Alcoholism Paternal Grandmother   . Arthritis Paternal Grandmother   . Breast cancer  Paternal Grandmother   . Hypertension Paternal Grandmother   . Alcoholism Paternal Grandfather   . Arthritis Paternal Grandfather   . Stroke Paternal Grandfather   . Hypertension Paternal Grandfather   . Diabetes Paternal Grandfather     Allergies  Allergen Reactions  . Molds & Smuts Other (See Comments)  . Other Other (See Comments)  . Pollen Extract Other (See Comments)  . Sulfa Antibiotics Hives    Medication list has been reviewed and updated.  No current outpatient prescriptions on file prior to visit.   No current facility-administered medications on file prior to visit.     Review of Systems:  As per HPI- otherwise negative.   Physical Examination: Vitals:   05/26/17 0939  BP: 120/82  Pulse: 85  Temp: 97.9 F (36.6 C)   Vitals:   05/26/17 0939  Weight: 193 lb 12.8 oz (87.9 kg)  Height: 6' 1"  (1.854 m)   Body mass index is 25.57 kg/m. Ideal Body Weight: Weight in (lb) to have BMI = 25:  189.1  GEN: WDWN, NAD, Non-toxic, A & O x 3, looks well, normal weight HEENT: Atraumatic, Normocephalic. Neck supple. No masses, No LAD.  Bilateral TM wnl, oropharynx normal.  PEERL,EOMI.   Ears and Nose: No external deformity. CV: RRR, No M/G/R. No JVD. No thrill. No extra heart sounds. PULM: CTA B, no wheezes, crackles, rhonchi. No retractions. No resp. distress. No accessory muscle use. ABD: S, NT, ND, +BS. No rebound. No HSM. EXTR: No c/c/e NEURO Normal gait.  PSYCH: Normally interactive. Conversant. Not depressed or anxious appearing.  Calm demeanor.    Assessment and Plan: Physical exam  Screening for diabetes mellitus - Plan: Comprehensive metabolic panel, Hemoglobin A1c  Screening for hyperlipidemia - Plan: Lipid panel  Screening for deficiency anemia - Plan: CBC  Chronic reflux esophagitis - Plan: Ambulatory referral to Gastroenterology, esomeprazole (NEXIUM) 20 MG capsule  Here today for a CPE Labs pending as above He is generally in good health except for chronic GERD- will refer to GI as he may need an upper GI Refilled her nexium for him   Signed Lamar Blinks, MD

## 2017-05-26 ENCOUNTER — Ambulatory Visit (INDEPENDENT_AMBULATORY_CARE_PROVIDER_SITE_OTHER): Payer: 59 | Admitting: Family Medicine

## 2017-05-26 ENCOUNTER — Encounter: Payer: Self-pay | Admitting: Family Medicine

## 2017-05-26 VITALS — BP 120/82 | HR 85 | Temp 97.9°F | Ht 73.0 in | Wt 193.8 lb

## 2017-05-26 DIAGNOSIS — K2100 Gastro-esophageal reflux disease with esophagitis, without bleeding: Secondary | ICD-10-CM

## 2017-05-26 DIAGNOSIS — Z131 Encounter for screening for diabetes mellitus: Secondary | ICD-10-CM

## 2017-05-26 DIAGNOSIS — Z0000 Encounter for general adult medical examination without abnormal findings: Secondary | ICD-10-CM

## 2017-05-26 DIAGNOSIS — Z13220 Encounter for screening for lipoid disorders: Secondary | ICD-10-CM

## 2017-05-26 DIAGNOSIS — Z130 Encounter for screening for diseases of the blood and blood-forming organs and certain disorders involving the immune mechanism: Secondary | ICD-10-CM

## 2017-05-26 LAB — CBC
HCT: 43.3 % (ref 39.0–52.0)
Hemoglobin: 15 g/dL (ref 13.0–17.0)
MCHC: 34.6 g/dL (ref 30.0–36.0)
MCV: 84.5 fl (ref 78.0–100.0)
Platelets: 240 10*3/uL (ref 150.0–400.0)
RBC: 5.13 Mil/uL (ref 4.22–5.81)
RDW: 13.4 % (ref 11.5–15.5)
WBC: 5.4 10*3/uL (ref 4.0–10.5)

## 2017-05-26 LAB — COMPREHENSIVE METABOLIC PANEL
ALK PHOS: 43 U/L (ref 39–117)
ALT: 20 U/L (ref 0–53)
AST: 20 U/L (ref 0–37)
Albumin: 4.3 g/dL (ref 3.5–5.2)
BILIRUBIN TOTAL: 0.6 mg/dL (ref 0.2–1.2)
BUN: 12 mg/dL (ref 6–23)
CO2: 27 meq/L (ref 19–32)
CREATININE: 0.93 mg/dL (ref 0.40–1.50)
Calcium: 9.3 mg/dL (ref 8.4–10.5)
Chloride: 104 mEq/L (ref 96–112)
GFR: 94.3 mL/min (ref >60.00)
GLUCOSE: 90 mg/dL (ref 70–99)
Potassium: 4 mEq/L (ref 3.5–5.1)
SODIUM: 137 meq/L (ref 135–145)
TOTAL PROTEIN: 6.7 g/dL (ref 6.0–8.3)

## 2017-05-26 LAB — LIPID PANEL
CHOL/HDL RATIO: 4
Cholesterol: 193 mg/dL (ref 0–200)
HDL: 46.5 mg/dL (ref >39.00)
LDL Cholesterol: 130 mg/dL — ABNORMAL HIGH (ref 0–99)
NONHDL: 146.93
Triglycerides: 83 mg/dL (ref 0.0–149.0)
VLDL: 16.6 mg/dL (ref 0.0–40.0)

## 2017-05-26 LAB — HEMOGLOBIN A1C: HEMOGLOBIN A1C: 5.4 % (ref 4.6–6.5)

## 2017-05-26 MED ORDER — ESOMEPRAZOLE MAGNESIUM 20 MG PO CPDR: 20.0000 mg | DELAYED_RELEASE_CAPSULE | Freq: Every day | ORAL | 3 refills | Status: DC

## 2017-05-26 NOTE — Patient Instructions (Signed)
It was nice to see you today!  I will be in touch with your labs asap Continue to exercise and take good care of yourself We will set you up to see a gastroenterologist for consideration of an upper GI test   Health Maintenance, Male A healthy lifestyle and preventive care is important for your health and wellness. Ask your health care provider about what schedule of regular examinations is right for you. What should I know about weight and diet? Eat a Healthy Diet  Eat plenty of vegetables, fruits, whole grains, low-fat dairy products, and lean protein.  Do not eat a lot of foods high in solid fats, added sugars, or salt.  Maintain a Healthy Weight Regular exercise can help you achieve or maintain a healthy weight. You should:  Do at least 150 minutes of exercise each week. The exercise should increase your heart rate and make you sweat (moderate-intensity exercise).  Do strength-training exercises at least twice a week.  Watch Your Levels of Cholesterol and Blood Lipids  Have your blood tested for lipids and cholesterol every 5 years starting at 43 years of age. If you are at high risk for heart disease, you should start having your blood tested when you are 43 years old. You may need to have your cholesterol levels checked more often if: ? Your lipid or cholesterol levels are high. ? You are older than 43 years of age. ? You are at high risk for heart disease.  What should I know about cancer screening? Many types of cancers can be detected early and may often be prevented. Lung Cancer  You should be screened every year for lung cancer if: ? You are a current smoker who has smoked for at least 30 years. ? You are a former smoker who has quit within the past 15 years.  Talk to your health care provider about your screening options, when you should start screening, and how often you should be screened.  Colorectal Cancer  Routine colorectal cancer screening usually begins at  43 years of age and should be repeated every 5-10 years until you are 43 years old. You may need to be screened more often if early forms of precancerous polyps or small growths are found. Your health care provider may recommend screening at an earlier age if you have risk factors for colon cancer.  Your health care provider may recommend using home test kits to check for hidden blood in the stool.  A small camera at the end of a tube can be used to examine your colon (sigmoidoscopy or colonoscopy). This checks for the earliest forms of colorectal cancer.  Prostate and Testicular Cancer  Depending on your age and overall health, your health care provider may do certain tests to screen for prostate and testicular cancer.  Talk to your health care provider about any symptoms or concerns you have about testicular or prostate cancer.  Skin Cancer  Check your skin from head to toe regularly.  Tell your health care provider about any new moles or changes in moles, especially if: ? There is a change in a mole's size, shape, or color. ? You have a mole that is larger than a pencil eraser.  Always use sunscreen. Apply sunscreen liberally and repeat throughout the day.  Protect yourself by wearing long sleeves, pants, a wide-brimmed hat, and sunglasses when outside.  What should I know about heart disease, diabetes, and high blood pressure?  If you are 18-39 years of  age, have your blood pressure checked every 3-5 years. If you are 20 years of age or older, have your blood pressure checked every year. You should have your blood pressure measured twice-once when you are at a hospital or clinic, and once when you are not at a hospital or clinic. Record the average of the two measurements. To check your blood pressure when you are not at a hospital or clinic, you can use: ? An automated blood pressure machine at a pharmacy. ? A home blood pressure monitor.  Talk to your health care provider about  your target blood pressure.  If you are between 54-42 years old, ask your health care provider if you should take aspirin to prevent heart disease.  Have regular diabetes screenings by checking your fasting blood sugar level. ? If you are at a normal weight and have a low risk for diabetes, have this test once every three years after the age of 1. ? If you are overweight and have a high risk for diabetes, consider being tested at a younger age or more often.  A one-time screening for abdominal aortic aneurysm (AAA) by ultrasound is recommended for men aged 64-75 years who are current or former smokers. What should I know about preventing infection? Hepatitis B If you have a higher risk for hepatitis B, you should be screened for this virus. Talk with your health care provider to find out if you are at risk for hepatitis B infection. Hepatitis C Blood testing is recommended for:  Everyone born from [birth year] through 1967.  Anyone with known risk factors for hepatitis C.  Sexually Transmitted Diseases (STDs)  You should be screened each year for STDs including gonorrhea and chlamydia if: ? You are sexually active and are younger than 43 years of age. ? You are older than 43 years of age and your health care provider tells you that you are at risk for this type of infection. ? Your sexual activity has changed since you were last screened and you are at an increased risk for chlamydia or gonorrhea. Ask your health care provider if you are at risk.  Talk with your health care provider about whether you are at high risk of being infected with HIV. Your health care provider may recommend a prescription medicine to help prevent HIV infection.  What else can I do?  Schedule regular health, dental, and eye exams.  Stay current with your vaccines (immunizations).  Do not use any tobacco products, such as cigarettes, chewing tobacco, and e-cigarettes. If you need help quitting, ask your health care  provider.  Limit alcohol intake to no more than 2 drinks per day. One drink equals 12 ounces of beer, 5 ounces of wine, or 1 ounces of hard liquor.  Do not use street drugs.  Do not share needles.  Ask your health care provider for help if you need support or information about quitting drugs.  Tell your health care provider if you often feel depressed.  Tell your health care provider if you have ever been abused or do not feel safe at home. This information is not intended to replace advice given to you by your health care provider. Make sure you discuss any questions you have with your health care provider. Document Released: 05/21/2008 Document Revised: 07/22/2016 Document Reviewed: 08/27/2015 Elsevier Interactive Patient Education  [employer].

## 2017-05-31 ENCOUNTER — Encounter: Payer: Self-pay | Admitting: Gastroenterology

## 2017-07-14 ENCOUNTER — Encounter: Payer: Self-pay | Admitting: Gastroenterology

## 2017-07-14 ENCOUNTER — Ambulatory Visit (INDEPENDENT_AMBULATORY_CARE_PROVIDER_SITE_OTHER): Payer: 59 | Admitting: Gastroenterology

## 2017-07-14 VITALS — BP 110/84 | HR 68 | Ht 73.0 in | Wt 194.8 lb

## 2017-07-14 DIAGNOSIS — K219 Gastro-esophageal reflux disease without esophagitis: Secondary | ICD-10-CM | POA: Diagnosis not present

## 2017-07-14 NOTE — Progress Notes (Signed)
[facility] Consult Note:  History: Rodney Harris 07/14/2017  Referring physician: Darreld Mclean, MD  Reason for consult/chief complaint: Gastroesophageal Reflux (Medically controlled, no compliants today)   Subjective  HPI:  Rodney Harris was referred by primary care noted above for years of chronic GERD symptoms. He reports having intermittent pyrosis and regurgitation since adolescence. He was initially on Prevacid, and then got better relief on Nexium. The years he has taken it for months at a time and then we'll wean off and do well for perhaps several months or longer before symptoms recur. He would normally be manifest as substernal burning with occasional dysphagia, the latter resolves on PPI therapy. He currently feels quite well, denies dysphagia, odynophagia, early satiety, vomiting or weight loss. He denies a family history of esophageal cancer or Barrett's esophagus. His primary care provider was concerned because of the duration of his symptoms and need for intermittent medicine.   ROS:  Review of Systems  Constitutional: Negative for appetite change and unexpected weight change.  HENT: Negative for mouth sores and voice change.   Eyes: Negative for pain and redness.  Respiratory: Negative for cough and shortness of breath.   Cardiovascular: Negative for chest pain and palpitations.  Genitourinary: Negative for dysuria and hematuria.  Musculoskeletal: Positive for back pain. Negative for arthralgias and myalgias.  Skin: Negative for pallor and rash.  Neurological: Positive for headaches. Negative for weakness.  Hematological: Negative for adenopathy.     Past Medical History: Past Medical History:  Diagnosis Date  . Acid reflux   . Hernia      Past Surgical History: Past Surgical History:  Procedure Laterality Date  . HERNIA REPAIR     1982  . HERNIA REPAIR     2011  . WISDOM TOOTH EXTRACTION       Family History: Family  History  Problem Relation Age of Onset  . Breast cancer Mother   . Hypertension Father   . Arthritis Maternal Grandmother   . Hypertension Maternal Grandmother   . Arthritis Maternal Grandfather   . Lung cancer Maternal Grandfather   . Hypertension Maternal Grandfather   . Alcoholism Paternal Grandmother   . Arthritis Paternal Grandmother   . Breast cancer Paternal Grandmother   . Hypertension Paternal Grandmother   . Alcoholism Paternal Grandfather   . Arthritis Paternal Grandfather   . Stroke Paternal Grandfather   . Hypertension Paternal Grandfather   . Diabetes Paternal Grandfather     Social History: Social History   Social History  . Marital status: Married    Spouse name: N/A  . Number of children: N/A  . Years of education: N/A   Social History Main Topics  . Smoking status: Former Smoker    Quit date: 12/07/2013  . Smokeless tobacco: Never Used  . Alcohol use Yes     Comment: occassionally  . Drug use: No  . Sexual activity: Not Asked   Other Topics Concern  . None   Social History Narrative  . None    Allergies: Allergies  Allergen Reactions  . Bee Venom Anaphylaxis  . Molds & Smuts Other (See Comments)  . Other Other (See Comments)  . Pollen Extract Other (See Comments)  . Sulfa Antibiotics Hives    Outpatient Meds: Current Outpatient Prescriptions  Medication Sig Dispense Refill  . esomeprazole (NEXIUM) 20 MG capsule Take 1 capsule (20 mg total) by mouth daily at 12 noon. (Patient not taking: Reported on 07/14/2017) 90 capsule 3  .  fexofenadine (ALLEGRA) 60 MG tablet Take 60 mg by mouth 2 (two) times daily.     No current facility-administered medications for this visit.       ___________________________________________________________________ Objective   Exam:  BP 110/84   Pulse 68   Ht 6' 1"  (1.854 m)   Wt 194 lb 12.8 oz (88.4 kg)   BMI 25.70 kg/m    General: this is a(n) Well-appearing man with good muscle mass and normal vocal  quality   Eyes: sclera anicteric, no redness  ENT: oral mucosa moist without lesions, no cervical or supraclavicular lymphadenopathy, good dentition  CV: RRR without murmur, S1/S2, no JVD, no peripheral edema  Resp: clear to auscultation bilaterally, normal RR and effort noted  GI: soft, no tenderness, with active bowel sounds. No guarding or palpable organomegaly noted.  Skin; warm and dry, no rash or jaundice noted  Neuro: awake, alert and oriented x 3. Normal gross motor function and fluent speech   Assessment: Encounter Diagnosis  Name Primary?  . Gastroesophageal reflux disease, esophagitis presence not specified Yes    Many years of GERD without current red flag symptoms. He and his primary care provider would like to know whether he has suffered any long-term consequences of this including erosive esophagitis and Barrett's esophagus.  Plan:  EGD and he is agreeable  The benefits and risks of the planned procedure were described in detail with the patient or (when appropriate) their health care proxy.  Risks were outlined as including, but not limited to, bleeding, infection, perforation, adverse medication reaction leading to cardiac or pulmonary decompensation, or pancreatitis (if ERCP).  The limitation of incomplete mucosal visualization was also discussed.  No guarantees or warranties were given.   Thank you for the courtesy of this consult.  Please call me with any questions or concerns.  Nelida Meuse III  CC: Copland, Gay Filler, MD

## 2017-07-14 NOTE — Patient Instructions (Signed)
You have been scheduled for an endoscopy. Please follow written instructions given to you at your visit today. If you use inhalers (even only as needed), please bring them with you on the day of your procedure. Your physician has requested that you go to www.startemmi.com and enter the access code given to you at your visit today. This web site gives a general overview about your procedure. However, you should still follow specific instructions given to you by our office regarding your preparation for the procedure.  If you are age 39 or older, your body mass index should be between 23-30. Your Body mass index is 25.7 kg/m. If this is out of the aforementioned range listed, please consider follow up with your Primary Care Provider.  If you are age 44 or younger, your body mass index should be between 19-25. Your Body mass index is 25.7 kg/m. If this is out of the aformentioned range listed, please consider follow up with your Primary Care Provider.     Thank you for choosing [facility]  Dr Wilfrid Lund III

## 2017-07-21 ENCOUNTER — Encounter: Payer: Self-pay | Admitting: Gastroenterology

## 2017-08-04 ENCOUNTER — Encounter: Payer: Self-pay | Admitting: Gastroenterology

## 2017-08-04 ENCOUNTER — Ambulatory Visit (AMBULATORY_SURGERY_CENTER): Payer: 59 | Admitting: Gastroenterology

## 2017-08-04 VITALS — BP 105/69 | HR 54 | Temp 98.6°F | Resp 22 | Ht 73.0 in | Wt 194.0 lb

## 2017-08-04 DIAGNOSIS — K219 Gastro-esophageal reflux disease without esophagitis: Secondary | ICD-10-CM

## 2017-08-04 MED ORDER — SODIUM CHLORIDE 0.9 % IV SOLN: 500.0000 mL | INTRAVENOUS | Status: DC

## 2017-08-04 NOTE — Patient Instructions (Signed)
YOU HAD AN ENDOSCOPIC PROCEDURE TODAY AT [facility]:   Refer to the procedure report that was given to you for any specific questions about what was found during the examination.  If the procedure report does not answer your questions, please call your gastroenterologist to clarify.  If you requested that your care partner not be given the details of your procedure findings, then the procedure report has been included in a sealed envelope for you to review at your convenience later.  YOU SHOULD EXPECT: Some feelings of bloating in the abdomen. Passage of more gas than usual.  Walking can help get rid of the air that was put into your GI tract during the procedure and reduce the bloating. If you had a lower endoscopy (such as a colonoscopy or flexible sigmoidoscopy) you may notice spotting of blood in your stool or on the toilet paper. If you underwent a bowel prep for your procedure, you may not have a normal bowel movement for a few days.  Please Note:  You might notice some irritation and congestion in your nose or some drainage.  This is from the oxygen used during your procedure.  There is no need for concern and it should clear up in a day or so.  SYMPTOMS TO REPORT IMMEDIATELY:   Following upper endoscopy (EGD)  Vomiting of blood or coffee ground material  New chest pain or pain under the shoulder blades  Painful or persistently difficult swallowing  New shortness of breath  Fever of 100F or higher  Black, tarry-looking stools  For urgent or emergent issues, a gastroenterologist can be reached at any hour by calling [phone].   DIET:  We do recommend a small meal at first, but then you may proceed to your regular diet.  Drink plenty of fluids but you should avoid alcoholic beverages for 24 hours.  MEDICATIONS: Continue present medications.  Follow an anti-reflux regimen indefinitely.  Please see handouts given to you by your recovery nurse.  ACTIVITY:  You  should plan to take it easy for the rest of today and you should NOT DRIVE or use heavy machinery until tomorrow (because of the sedation medicines used during the test).    FOLLOW UP: Our staff will call the number listed on your records the next business day following your procedure to check on you and address any questions or concerns that you may have regarding the information given to you following your procedure. If we do not reach you, we will leave a message.  However, if you are feeling well and you are not experiencing any problems, there is no need to return our call.  We will assume that you have returned to your regular daily activities without incident.  If any biopsies were taken you will be contacted by phone or by letter within the next 1-3 weeks.  Please call us at [phone] if you have not heard about the biopsies in 3 weeks.   Thank you for allowing [place] to provide for your healthcare needs today.  SIGNATURES/CONFIDENTIALITY: You and/or your care partner have signed paperwork which will be entered into your electronic medical record.  These signatures attest to the fact that that the information above on your After Visit Summary has been reviewed and is understood.  Full responsibility of the confidentiality of this discharge information lies with you and/or your care-partner.

## 2017-08-04 NOTE — Progress Notes (Signed)
Dental advisory given to patient

## 2017-08-04 NOTE — Progress Notes (Signed)
A/ox3 pleased with MAC, report to Sarah RN 

## 2017-08-04 NOTE — Op Note (Signed)
[name] Procedure Date: 08/04/2017 10:07 AM MRN: [id number] Endoscopist: Mallie Mussel L. Loletha Carrow , MD Age: 41 Referring MD:  Date of Birth: 07/08/1976 Gender: Male Account #: [id number] Procedure:                Upper GI endoscopy Indications:              Gastro-esophageal reflux disease Medicines:                Monitored Anesthesia Care Procedure:                Pre-Anesthesia Assessment:                           - Prior to the procedure, a History and Physical                            was performed, and patient medications and                            allergies were reviewed. The patient's tolerance of                            previous anesthesia was also reviewed. The risks                            and benefits of the procedure and the sedation                            options and risks were discussed with the patient.                            All questions were answered, and informed consent                            was obtained. Prior Anticoagulants: The patient has                            taken no previous anticoagulant or antiplatelet                            agents. ASA Grade Assessment: II - A patient with                            mild systemic disease. After reviewing the risks                            and benefits, the patient was deemed in                            satisfactory condition to undergo the procedure.                           After obtaining informed consent, the endoscope was  passed under direct vision. Throughout the                            procedure, the patient's blood pressure, pulse, and                            oxygen saturations were monitored continuously. The                            Endoscope was introduced through the mouth, and                            advanced to the second part of duodenum. The upper                            GI endoscopy was  accomplished without difficulty.                            The patient tolerated the procedure well. Scope In: Scope Out: Findings:                 The larynx was normal.                           The esophagus was normal. Specifically, no                            esophagitis or Barrett's esophagus was seen.                           The stomach was normal.                           The cardia and gastric fundus were normal on                            retroflexion.                           The examined duodenum was normal. Complications:            No immediate complications. Estimated Blood Loss:     Estimated blood loss: none. Impression:               - Normal larynx.                           - Normal esophagus.                           - Normal stomach.                           - Normal examined duodenum.                           - No specimens collected. Recommendation:           - Patient has a  contact number available for                            emergencies. The signs and symptoms of potential                            delayed complications were discussed with the                            patient. Return to normal activities tomorrow.                            Written discharge instructions were provided to the                            patient.                           - Resume previous diet.                           - Continue present medications.                           - Follow an antireflux regimen indefinitely.                           - Return to my office as needed. Henry L. Loletha Carrow, MD 08/04/2017 10:25:12 AM This report has been signed electronically.

## 2017-08-05 ENCOUNTER — Telehealth: Payer: Self-pay | Admitting: *Deleted

## 2017-08-05 NOTE — Telephone Encounter (Signed)
  Follow up Call-  Call back number 08/04/2017  Post procedure Call Back phone  # [phone]  Permission to leave phone message Yes  Some recent data might be hidden     Patient questions:  Do you have a fever, pain , or abdominal swelling? No. Pain Score  0 *  Have you tolerated food without any problems? Yes.    Have you been able to return to your normal activities? Yes.    Do you have any questions about your discharge instructions: Diet   No. Medications  No. Follow up visit  No.  Do you have questions or concerns about your Care? No.  Actions: * If pain score is 4 or above: No action needed, pain <4.

## 2017-08-10 ENCOUNTER — Telehealth: Payer: Self-pay | Admitting: Family Medicine

## 2017-08-10 ENCOUNTER — Encounter: Payer: Self-pay | Admitting: Family Medicine

## 2017-08-10 MED ORDER — ATOVAQUONE-PROGUANIL HCL 250-100 MG PO TABS: 1.0000 | ORAL_TABLET | Freq: Every day | ORAL | 0 refills | Status: DC

## 2017-08-10 NOTE — Addendum Note (Signed)
Addended by: Lamar Blinks C on: 08/10/2017 06:13 PM   Modules accepted: Orders

## 2017-08-10 NOTE — Telephone Encounter (Signed)
Pt says that he is going out of the country to [place] and would like to know if provider gives antimylia medication?   Please advise?

## 2017-08-11 NOTE — Telephone Encounter (Signed)
Already addressed

## 2017-08-12 DIAGNOSIS — Z23 Encounter for immunization: Secondary | ICD-10-CM | POA: Diagnosis not present

## 2017-10-31 DIAGNOSIS — Z23 Encounter for immunization: Secondary | ICD-10-CM | POA: Diagnosis not present

## 2017-12-16 ENCOUNTER — Ambulatory Visit: Payer: 59 | Admitting: Family Medicine

## 2017-12-16 ENCOUNTER — Encounter: Payer: Self-pay | Admitting: Family Medicine

## 2017-12-16 VITALS — BP 108/72 | HR 79 | Temp 98.2°F | Ht 73.0 in | Wt 198.4 lb

## 2017-12-16 DIAGNOSIS — J40 Bronchitis, not specified as acute or chronic: Secondary | ICD-10-CM

## 2017-12-16 MED ORDER — ALBUTEROL SULFATE HFA 108 (90 BASE) MCG/ACT IN AERS
2.0000 | INHALATION_SPRAY | Freq: Four times a day (QID) | RESPIRATORY_TRACT | 0 refills | Status: DC | PRN
Start: 2017-12-16 — End: 2019-02-15

## 2017-12-16 MED ORDER — BENZONATATE 100 MG PO CAPS: 100.0000 mg | ORAL_CAPSULE | Freq: Three times a day (TID) | ORAL | 0 refills | Status: DC | PRN

## 2017-12-16 MED ORDER — AZITHROMYCIN 250 MG PO TABS: ORAL_TABLET | ORAL | 0 refills | Status: DC

## 2017-12-16 NOTE — Progress Notes (Signed)
Pre visit review using our clinic review tool, if applicable. No additional management support is needed unless otherwise documented below in the visit note. 

## 2017-12-16 NOTE — Patient Instructions (Signed)
Continue to push fluids, practice good hand hygiene, and cover your mouth if you cough.  If you start having fevers, shaking or shortness of breath, seek immediate care.  

## 2017-12-16 NOTE — Progress Notes (Signed)
Chief Complaint  Patient presents with  . Cough    Hector Shade here for URI complaints.  Duration: 3 weeks  Associated symptoms: wheezing, chest tightness and cough Denies: sinus congestion, sinus pain, rhinorrhea, itchy watery eyes, ear pain, ear drainage, sore throat, shortness of breath, myalgia and fevers/rigors Treatment to date: Dayquil, Mucinex D, Amoxicillin Sick contacts: Yes- kids  ROS:  Const: Denies fevers HEENT: As noted in HPI Lungs: +SOB  Past Medical History:  Diagnosis Date  . Acid reflux   . Hernia    Family History  Problem Relation Age of Onset  . Breast cancer Mother   . Hypertension Father   . Arthritis Maternal Grandmother   . Hypertension Maternal Grandmother   . Arthritis Maternal Grandfather   . Lung cancer Maternal Grandfather   . Hypertension Maternal Grandfather   . Alcoholism Paternal Grandmother   . Arthritis Paternal Grandmother   . Breast cancer Paternal Grandmother   . Hypertension Paternal Grandmother   . Alcoholism Paternal Grandfather   . Arthritis Paternal Grandfather   . Stroke Paternal Grandfather   . Hypertension Paternal Grandfather   . Diabetes Paternal Grandfather   . Colon cancer Neg Hx   . Esophageal cancer Neg Hx   . Stomach cancer Neg Hx   . Rectal cancer Neg Hx     BP 108/72 (BP Location: Left Arm, Patient Position: Sitting, Cuff Size: Large)   Pulse 79   Temp 98.2 F (36.8 C) (Oral)   Ht 6' 1"  (1.854 m)   Wt 198 lb 6 oz (90 kg)   SpO2 97%   BMI 26.17 kg/m  General: Awake, alert, appears stated age HEENT: AT, [place], ears patent b/l and TM's neg, nares patent w/o discharge, pharynx pink and without exudates, MMM Neck: No masses or asymmetry Heart: RRR Lungs: +diffuse exp wheezes, no accessory muscle use Psych: Age appropriate judgment and insight, normal mood and affect  Wheezy bronchitis - Plan: benzonatate (TESSALON) 100 MG capsule, azithromycin (ZITHROMAX) 250 MG tablet, albuterol (PROVENTIL  HFA;VENTOLIN HFA) 108 (90 Base) MCG/ACT inhaler  Orders as above. Steroids given wheezing, albuterol given sob and hx of allergy induced asthma, and abx given duration. Continue to push fluids, practice good hand hygiene, cover mouth when coughing. F/u prn. If starting to experience fevers, shaking, or shortness of breath, seek immediate care. Pt voiced understanding and agreement to the plan.  [place], DO 12/16/17 9:43 AM

## 2017-12-21 ENCOUNTER — Encounter: Payer: Self-pay | Admitting: Family Medicine

## 2017-12-29 ENCOUNTER — Ambulatory Visit: Payer: 59 | Admitting: Family Medicine

## 2017-12-29 ENCOUNTER — Encounter: Payer: Self-pay | Admitting: Family Medicine

## 2017-12-29 ENCOUNTER — Ambulatory Visit (HOSPITAL_BASED_OUTPATIENT_CLINIC_OR_DEPARTMENT_OTHER)
Admission: RE | Admit: 2017-12-29 | Discharge: 2017-12-29 | Disposition: A | Discharge status: Home / Self Care | Payer: 59 | Source: Ambulatory Visit | Attending: Family Medicine | Admitting: Family Medicine

## 2017-12-29 VITALS — BP 110/80 | HR 82 | Temp 97.9°F | Ht 73.0 in | Wt 199.1 lb

## 2017-12-29 DIAGNOSIS — R059 Cough, unspecified: Secondary | ICD-10-CM

## 2017-12-29 DIAGNOSIS — R0600 Dyspnea, unspecified: Secondary | ICD-10-CM | POA: Insufficient documentation

## 2017-12-29 DIAGNOSIS — J9801 Acute bronchospasm: Secondary | ICD-10-CM | POA: Diagnosis not present

## 2017-12-29 DIAGNOSIS — R05 Cough: Secondary | ICD-10-CM | POA: Insufficient documentation

## 2017-12-29 NOTE — Progress Notes (Signed)
Chief Complaint  Patient presents with  . Bronchitis    Rodney Harris here for URI complaints.  Duration: 5 weeks  Associated symptoms: cough, wheezing and shortness of breath Denies: sinus congestion, sinus pain, rhinorrhea, itchy watery eyes, ear pain, ear drainage, sore throat, myalgia and Fever/rigors Treatment to date: Amox, Zpak, albuterol (helpful) Sick contacts: No  Maybe 10-20% better overall, still bothersome.   ROS:  Const: Denies fevers HEENT: As noted in HPI Lungs: +SOB  Past Medical History:  Diagnosis Date  . Acid reflux   . Hernia    Family History  Problem Relation Age of Onset  . Breast cancer Mother   . Hypertension Father   . Arthritis Maternal Grandmother   . Hypertension Maternal Grandmother   . Arthritis Maternal Grandfather   . Lung cancer Maternal Grandfather   . Hypertension Maternal Grandfather   . Alcoholism Paternal Grandmother   . Arthritis Paternal Grandmother   . Breast cancer Paternal Grandmother   . Hypertension Paternal Grandmother   . Alcoholism Paternal Grandfather   . Arthritis Paternal Grandfather   . Stroke Paternal Grandfather   . Hypertension Paternal Grandfather   . Diabetes Paternal Grandfather   . Colon cancer Neg Hx   . Esophageal cancer Neg Hx   . Stomach cancer Neg Hx   . Rectal cancer Neg Hx     BP 110/80 (BP Location: Right Arm, Patient Position: Sitting, Cuff Size: Normal)   Pulse 82   Temp 97.9 F (36.6 C) (Oral)   Ht 6' 1"  (1.854 m)   Wt 199 lb 2 oz (90.3 kg)   SpO2 97%   BMI 26.27 kg/m  General: Awake, alert, appears stated age HEENT: AT, [place], ears patent b/l and TM's neg, nares patent w/o discharge, pharynx pink and without exudates, MMM Neck: No masses or asymmetry Heart: RRR Lungs: +diffuse wheezing, no accessory muscle use Psych: Age appropriate judgment and insight, normal mood and affect  Bronchospasm - Plan: DG Chest 2 View  Cough - Plan: DG Chest 2 View  Dyspnea, unspecified type -  Plan: DG Chest 2 View  Orders as above. CXR neg.  Start Advair, which he has at home (got in [place]).  We are empirically treating for asthma or allergy induced asthma. Send message in 2-3 weeks if no better, will consider PFTs at that point.  Pt voiced understanding and agreement to the plan.  [place], DO 12/29/17 3:38 PM

## 2017-12-29 NOTE — Patient Instructions (Addendum)
Send me a MyChart message in 2-3 weeks if things are not getting better.   Take the Advair, remember to rinse your mouth out after using.  We will send you a MyChart message regarding your XR results.   Let us know if you need anything.

## 2017-12-29 NOTE — Progress Notes (Signed)
Pre visit review using our clinic review tool, if applicable. No additional management support is needed unless otherwise documented below in the visit note. 

## 2017-12-30 ENCOUNTER — Encounter: Payer: Self-pay | Admitting: Family Medicine

## 2018-03-28 ENCOUNTER — Encounter: Payer: Self-pay | Admitting: Family Medicine

## 2018-06-13 ENCOUNTER — Encounter: Payer: Self-pay | Admitting: Gastroenterology

## 2018-07-01 ENCOUNTER — Other Ambulatory Visit: Payer: Self-pay | Admitting: Medical

## 2018-07-01 ENCOUNTER — Encounter: Payer: Self-pay | Admitting: Medical

## 2018-07-01 ENCOUNTER — Ambulatory Visit: Payer: 59 | Admitting: Medical

## 2018-07-01 VITALS — BP 127/75 | HR 56 | Temp 97.9°F | Resp 16 | Ht 73.0 in | Wt 196.2 lb

## 2018-07-01 DIAGNOSIS — R1013 Epigastric pain: Secondary | ICD-10-CM

## 2018-07-01 DIAGNOSIS — K2100 Gastro-esophageal reflux disease with esophagitis, without bleeding: Secondary | ICD-10-CM

## 2018-07-01 DIAGNOSIS — F419 Anxiety disorder, unspecified: Secondary | ICD-10-CM

## 2018-07-01 MED ORDER — RANITIDINE HCL 150 MG PO CAPS: 150.0000 mg | ORAL_CAPSULE | Freq: Two times a day (BID) | ORAL | 0 refills | Status: DC

## 2018-07-01 MED ORDER — CLONAZEPAM 0.5 MG PO TABS: 0.5000 mg | ORAL_TABLET | Freq: Two times a day (BID) | ORAL | 0 refills | Status: DC | PRN

## 2018-07-01 MED ORDER — CIMETIDINE 400 MG PO TABS: 400.0000 mg | ORAL_TABLET | Freq: Two times a day (BID) | ORAL | 0 refills | Status: DC

## 2018-07-01 MED ORDER — GI COCKTAIL ~~LOC~~
30.0000 mL | Freq: Once | ORAL | Status: AC
  Administered 2018-07-01: 30 mL via ORAL

## 2018-07-01 NOTE — Patient Instructions (Addendum)
For your history of GERD/reflux, I want you to continue with Nexium over-the-counter 2 tablets daily and will add ranitidine 150 mg twice daily.  Today we gave you a GI cocktail.  Recommend healthy diet.  Follow-up with GI in 3 weeks.  If signs and symptoms worsen or change to notify us.  If severe signs and symptoms change and recommend ED evaluation.  We did EKG for caution sake since you occasionally report some associated left shoulder pain with above.  EKG showed  sinus rhythm(but brady) today.  If you were to have recurrent severe shoulder pain or you have any associated chest pain then recommend ED evaluation.  Follow-up in 7 to 10 days with a PCP or myself.(As needed as well.)   After discussed the above with patient we did get into his level of stress and anxiety.  He was mentioning at times feeling almost panic attacks.  Decided to give him clonazepam No. 8 tablets and advised to use sparingly.  Rx advised and given regarding sedation.  Asked him to update me in a couple weeks as to how often he had to use the medication and briefly explained there are other options as well such as SSRI.  Will follow patient and see how he is doing in a couple weeks.

## 2018-07-01 NOTE — Telephone Encounter (Signed)
Tagamet sent to pt pharmacy since zantac denied by insurance.

## 2018-07-01 NOTE — Progress Notes (Addendum)
Subjective:    Patient ID: Rodney Harris, male    DOB: July 09, 1976, 42 y.o.   MRN: [id number]  HPI  Pt in for some history of acid reflux for years but has been controlled for years. He states for years it was controlled with nexium on and off in past. He describes using for 1-2 months sporacidally then eat better and then symptoms would resolve. He would eat better and symptoms would resolve but then excacerbation with poor diet.  Pt does admit belching but not severely. Pt states pain off and on. Sour taste in throat this morning when he woke. He states he is used to sour taste since he has had it so much. He has to clear his throat constantly and has dry cough.   He states eating will help for about 20 minutes and then afterwards feels worse.  Pt states chronic high stress past 2 months. Work, issue with child and surprise pregnancy.  Pt did have egd last year with Dr. Loletha Carrow in August. That egd looked good. Pt appointment with GI against 3 weeks ago.  Patient has slight knot feeling now in suprasternal notch..  Occasionally abdomen pain that will shoot upwards towards shoulder. Lat happened yesterday am. Lasted for about 10-15 minutes.   Pt on otc nexium 2 tab daily.    Review of Systems  Constitutional: Negative for chills, fatigue and fever.  Respiratory: Negative for cough, chest tightness, shortness of breath and wheezing.   Cardiovascular: Negative for chest pain and palpitations.  Gastrointestinal: Positive for abdominal pain. Negative for abdominal distention, blood in stool and constipation.  Musculoskeletal: Negative for back pain.  Skin: Negative for rash.  Neurological: Negative for dizziness, seizures, speech difficulty, weakness and headaches.  Hematological: Negative for adenopathy. Does not bruise/bleed easily.  Psychiatric/Behavioral: Negative for agitation, confusion, dysphoric mood, sleep disturbance and suicidal ideas. The patient is nervous/anxious.      Severe constant stress. Admits feeling anxious and sometime almost panic attacks.    Past Medical History:  Diagnosis Date  . Acid reflux   . Hernia      Social History   Socioeconomic History  . Marital status: Married    Spouse name: Not on file  . Number of children: Not on file  . Years of education: Not on file  . Highest education level: Not on file  Occupational History  . Not on file  Social Needs  . Financial resource strain: Not on file  . Food insecurity:    Worry: Not on file    Inability: Not on file  . Transportation needs:    Medical: Not on file    Non-medical: Not on file  Tobacco Use  . Smoking status: Former Smoker    Last attempt to quit: 12/07/2013    Years since quitting: 4.5  . Smokeless tobacco: Never Used  Substance and Sexual Activity  . Alcohol use: Yes    Comment: occassionally  . Drug use: No  . Sexual activity: Not on file  Lifestyle  . Physical activity:    Days per week: Not on file    Minutes per session: Not on file  . Stress: Not on file  Relationships  . Social connections:    Talks on phone: Not on file    Gets together: Not on file    Attends religious service: Not on file    Active member of club or organization: Not on file    Attends meetings of clubs or organizations:  Not on file    Relationship status: Not on file  . Intimate partner violence:    Fear of current or ex partner: Not on file    Emotionally abused: Not on file    Physically abused: Not on file    Forced sexual activity: Not on file  Other Topics Concern  . Not on file  Social History Narrative  . Not on file    Past Surgical History:  Procedure Laterality Date  . HERNIA REPAIR     1982  . HERNIA REPAIR     2011  . WISDOM TOOTH EXTRACTION      Family History  Problem Relation Age of Onset  . Breast cancer Mother   . Hypertension Father   . Arthritis Maternal Grandmother   . Hypertension Maternal Grandmother   . Arthritis Maternal  Grandfather   . Lung cancer Maternal Grandfather   . Hypertension Maternal Grandfather   . Alcoholism Paternal Grandmother   . Arthritis Paternal Grandmother   . Breast cancer Paternal Grandmother   . Hypertension Paternal Grandmother   . Alcoholism Paternal Grandfather   . Arthritis Paternal Grandfather   . Stroke Paternal Grandfather   . Hypertension Paternal Grandfather   . Diabetes Paternal Grandfather   . Colon cancer Neg Hx   . Esophageal cancer Neg Hx   . Stomach cancer Neg Hx   . Rectal cancer Neg Hx     Allergies  Allergen Reactions  . Bee Venom Anaphylaxis  . Molds & Smuts Other (See Comments)  . Other Other (See Comments)  . Pollen Extract Other (See Comments)  . Sulfa Antibiotics Hives    Current Outpatient Medications on File Prior to Visit  Medication Sig Dispense Refill  . albuterol (PROVENTIL HFA;VENTOLIN HFA) 108 (90 Base) MCG/ACT inhaler Inhale 2 puffs into the lungs every 6 (six) hours as needed for wheezing or shortness of breath. 1 Inhaler 0  . benzonatate (TESSALON) 100 MG capsule Take 1 capsule (100 mg total) by mouth 3 (three) times daily as needed. 30 capsule 0  . esomeprazole (NEXIUM) 20 MG capsule Take 1 capsule (20 mg total) by mouth daily at 12 noon. 90 capsule 3  . fexofenadine (ALLEGRA) 60 MG tablet Take 60 mg by mouth 2 (two) times daily.     Current Facility-Administered Medications on File Prior to Visit  Medication Dose Route Frequency Provider Last Rate Last Dose  . 0.9 %  sodium chloride infusion  500 mL Intravenous Continuous Danis, Estill Cotta III, MD        BP 127/75   Pulse (!) 56   Temp 97.9 F (36.6 C) (Oral)   Resp 16   Ht 6' 1"  (1.854 m)   Wt 196 lb 3.2 oz (89 kg)   SpO2 100%   BMI 25.89 kg/m       Objective:   Physical Exam  General Appearance- Not in acute distress.  HEENT Eyes- Scleraeral/Conjuntiva-bilat- Not Yellow. Mouth & Throat- Normal.  Chest and Lung Exam Auscultation: Breath  sounds:-Normal. Adventitious sounds:- No Adventitious sounds.  Cardiovascular Auscultation:Rythm - Regular. Heart Sounds -Normal heart sounds.  Abdomen Inspection:-Inspection Normal.  Palpation/Perucssion: Palpation and Percussion of the abdomen reveal- epigastric Tenderness, No Rebound tenderness, No rigidity(Guarding) and No Palpable abdominal masses.  Liver:-Normal.  Spleen:- Normal.   Back- no cva tenderness.      Assessment & Plan:  For your history of GERD/reflux, I want you to continue with Nexium over-the-counter 2 tablets daily and will add ranitidine 150  mg twice daily.  Today we gave you a GI cocktail.  Recommend healthy diet.  Follow-up with GI in 3 weeks.  If signs and symptoms worsen or change to notify us.  If severe signs and symptoms change and recommend ED evaluation.  We did EKG for caution sake since you occasionally report some associated left shoulder pain with above.  EKG showed normal sinus rhythm today.  If you were to have recurrent severe shoulder pain or you have any associated chest pain then recommend ED evaluation.  Follow-up in 7 to 10 days with a PCP or myself.(As needed as well.)   Regarding patient's low pulse on EKG.  Recheck pulse again and it was 60.  Also we discussed that he exercises a lot/long distance bike rides and he describes his pulse rate gets into 150 range on many of his training exercises.  Mackie Pai, PA-C

## 2018-07-02 ENCOUNTER — Encounter: Payer: Self-pay | Admitting: Medical

## 2018-07-19 ENCOUNTER — Ambulatory Visit: Payer: 59 | Admitting: Gastroenterology

## 2018-07-19 ENCOUNTER — Encounter: Payer: Self-pay | Admitting: Gastroenterology

## 2018-07-19 VITALS — BP 110/70 | HR 74 | Ht 73.0 in | Wt 191.4 lb

## 2018-07-19 DIAGNOSIS — R1319 Other dysphagia: Secondary | ICD-10-CM

## 2018-07-19 DIAGNOSIS — K219 Gastro-esophageal reflux disease without esophagitis: Secondary | ICD-10-CM

## 2018-07-19 DIAGNOSIS — R12 Heartburn: Secondary | ICD-10-CM | POA: Diagnosis not present

## 2018-07-19 DIAGNOSIS — R1310 Dysphagia, unspecified: Secondary | ICD-10-CM | POA: Diagnosis not present

## 2018-07-19 MED ORDER — SUCRALFATE 1 G PO TABS: 1.0000 g | ORAL_TABLET | Freq: Three times a day (TID) | ORAL | 2 refills | Status: DC

## 2018-07-19 NOTE — Progress Notes (Signed)
     [facility] Progress Note  Chief Complaint: Heartburn and dysphagia  Subjective  History:  Rodney Harris sees me for the first time since last year.  He had years of GERD requiring intermittent PPI therapy, first office visit 1 year ago.  EGD 08/04/2017 normal.  He was able to wean off PPI again, but about 2 months ago started having more frequent heartburn, dysphagia and a feeling of lump in the throat.  This coincided with a significant increase in work-related stress.  He went back to taking Nexium 40 mg daily without little or no relief of symptoms.  He was seen by primary care and reports that a GI cocktail on one occasion gave almost immediate relief.  He denies odynophagia, nausea, vomiting, early satiety or weight loss.  For the last few weeks he has been on Zantac 150 mg twice daily with about half to two thirds decrease in symptoms.  ROS: Cardiovascular:  no chest pain Respiratory: no dyspnea Allergic rhinitis Remainder of systems negative except as above The patient's Past Medical, Family and Social History were reviewed and are on file in the EMR.  Objective:  Med list reviewed  Current Outpatient Medications:  .  albuterol (PROVENTIL HFA;VENTOLIN HFA) 108 (90 Base) MCG/ACT inhaler, Inhale 2 puffs into the lungs every 6 (six) hours as needed for wheezing or shortness of breath., Disp: 1 Inhaler, Rfl: 0 .  fexofenadine (ALLEGRA) 60 MG tablet, Take 60 mg by mouth 2 (two) times daily as needed. , Disp: , Rfl:  .  ranitidine (ZANTAC) 150 MG capsule, Take 150 mg by mouth 2 (two) times daily., Disp: , Rfl:  .  sucralfate (CARAFATE) 1 g tablet, Take 1 tablet (1 g total) by mouth 3 (three) times daily., Disp: 60 tablet, Rfl: 2   Vital signs in last 24 hrs: Vitals:   07/19/18 1102  BP: 110/70  Pulse: 74    Physical Exam  He is well-appearing with normal vocal quality and good muscle mass  HEENT: sclera anicteric, oral mucosa moist without lesions  Neck: supple, no  thyromegaly, JVD or lymphadenopathy  Cardiac: RRR without murmurs, S1S2 heard, no peripheral edema  Pulm: clear to auscultation bilaterally, normal RR and effort noted  Abdomen: soft, no tenderness, with active bowel sounds. No guarding or palpable hepatosplenomegaly.  Skin; warm and dry, no jaundice or rash   @ASSESSMENTPLANBEGIN @ Assessment: Encounter Diagnoses  Name Primary?  . Gastroesophageal reflux disease, esophagitis presence not specified Yes  . Heartburn   . Esophageal dysphagia     He has symptoms of GERD with a probable functional/stress overlay.  He has had a healthy weight, does not smoke, and follows the other diet and lifestyle antireflux measures fairly well.  Plan:  Trial of Carafate tablets, dissolve in liquid up to 3 times daily as needed during periods of increased heartburn. Continue ranitidine if helpful. If insufficient symptom control, perhaps a trial of low-dose amitriptyline if that would be helpful. He will see me as needed. Total time 25 minutes, over half spent face-to-face with patient in counseling and coordination of care.   Nelida Meuse III

## 2018-07-19 NOTE — Patient Instructions (Signed)
If you are age 44 or older, your body mass index should be between 23-30. Your Body mass index is 25.25 kg/m. If this is out of the aforementioned range listed, please consider follow up with your Primary Care Provider.  If you are age 86 or younger, your body mass index should be between 19-25. Your Body mass index is 25.25 kg/m. If this is out of the aformentioned range listed, please consider follow up with your Primary Care Provider.   It was a pleasure to see you today!  Dr. Loletha Carrow

## 2018-08-26 ENCOUNTER — Encounter: Payer: Self-pay | Admitting: Family Medicine

## 2018-08-26 DIAGNOSIS — Z298 Encounter for other specified prophylactic measures: Secondary | ICD-10-CM

## 2018-08-26 DIAGNOSIS — Z889 Allergy status to unspecified drugs, medicaments and biological substances status: Secondary | ICD-10-CM

## 2018-08-29 MED ORDER — ATOVAQUONE-PROGUANIL HCL 250-100 MG PO TABS: 1.0000 | ORAL_TABLET | Freq: Every day | ORAL | 0 refills | Status: DC

## 2018-08-29 MED ORDER — EPINEPHRINE 0.3 MG/0.3ML IJ SOAJ: 0.3000 mg | Freq: Once | INTRAMUSCULAR | 99 refills | Status: AC

## 2018-08-31 IMAGING — DX DG CHEST 2V
2 series · 2 of 2 positions shown · non-contrast
Comparison: 06/26/2016

CLINICAL DATA: Cough. Bronchospasm. Dyspnea. Recent fever. Smoking
history.

EXAM:
CHEST  2 VIEW

[chest pa]
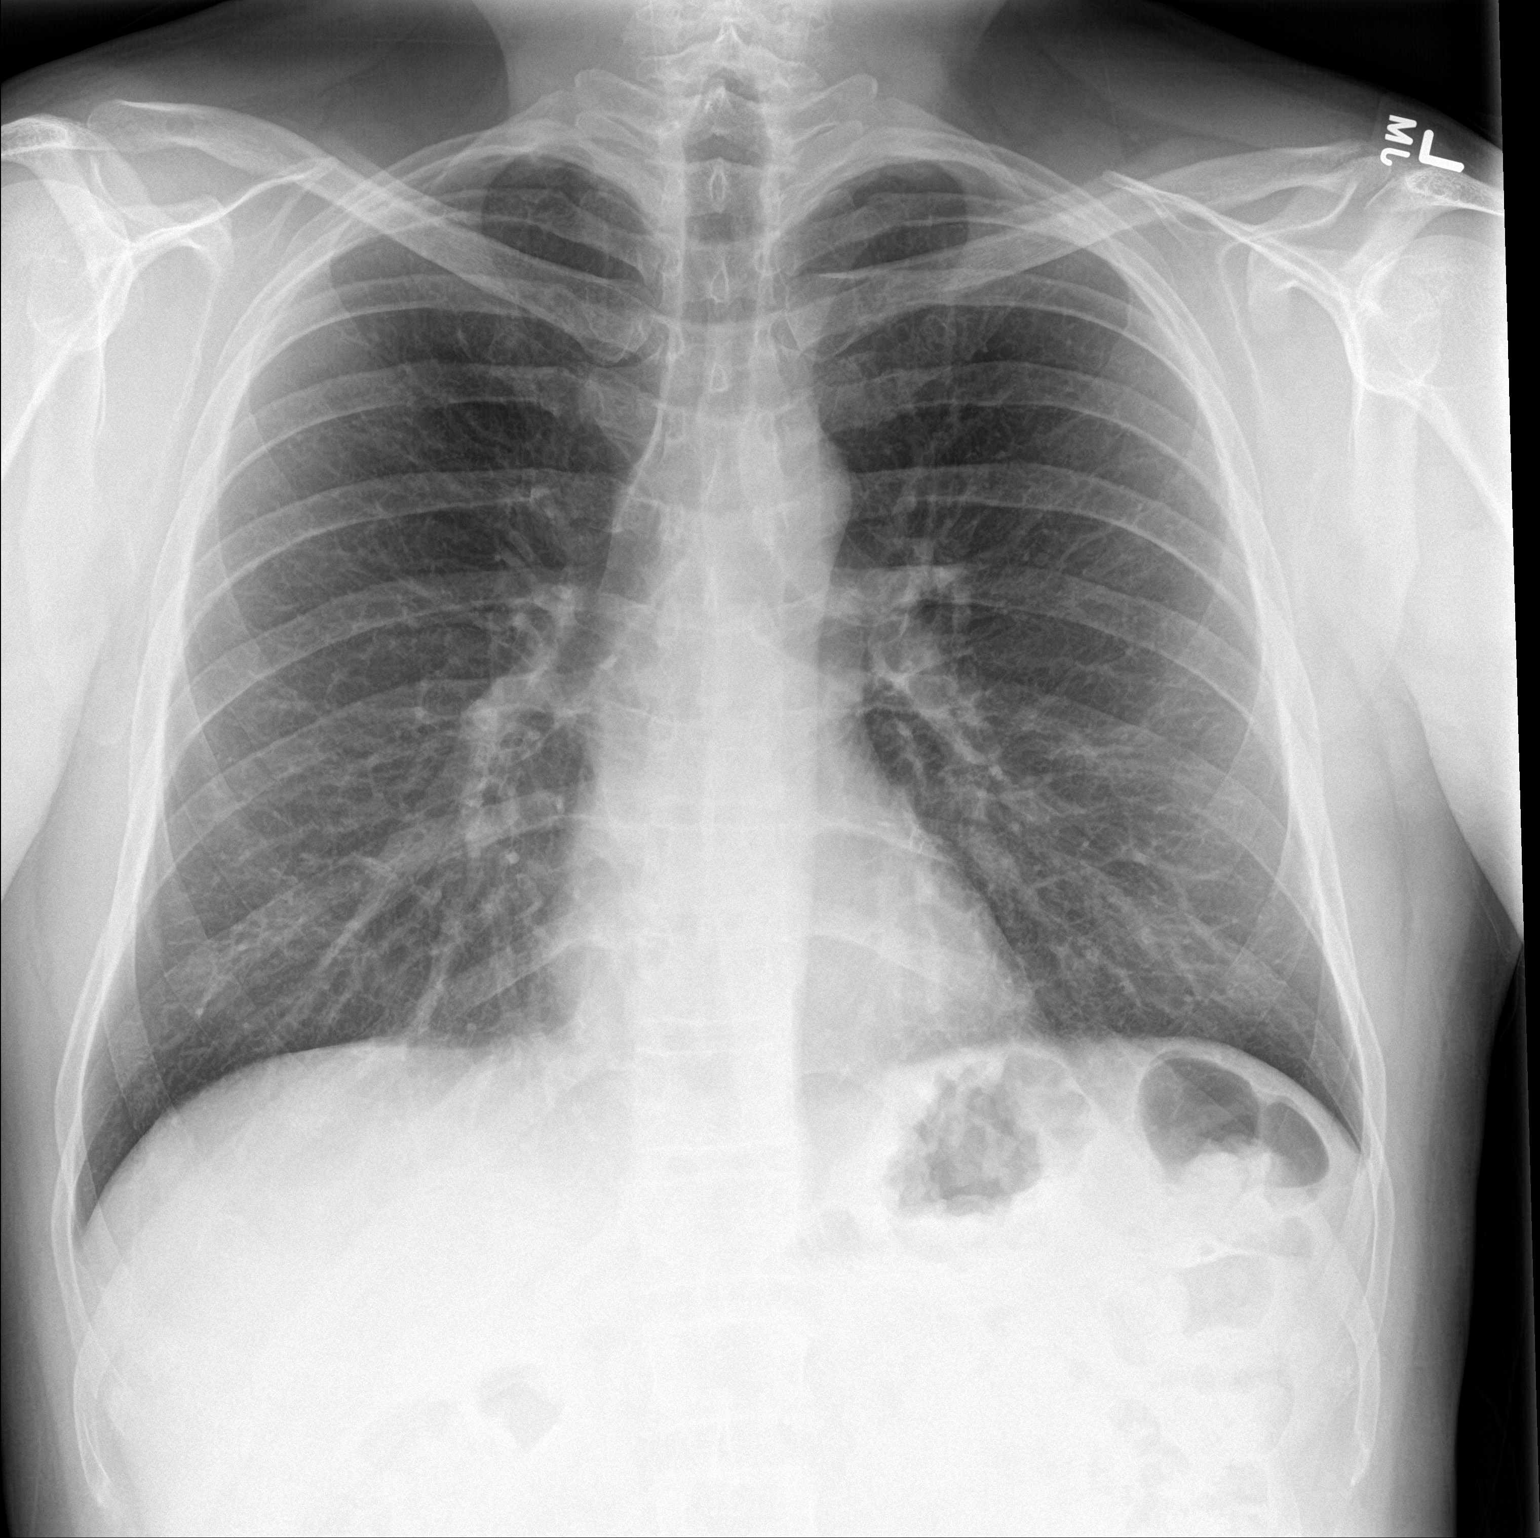

[chest lat]
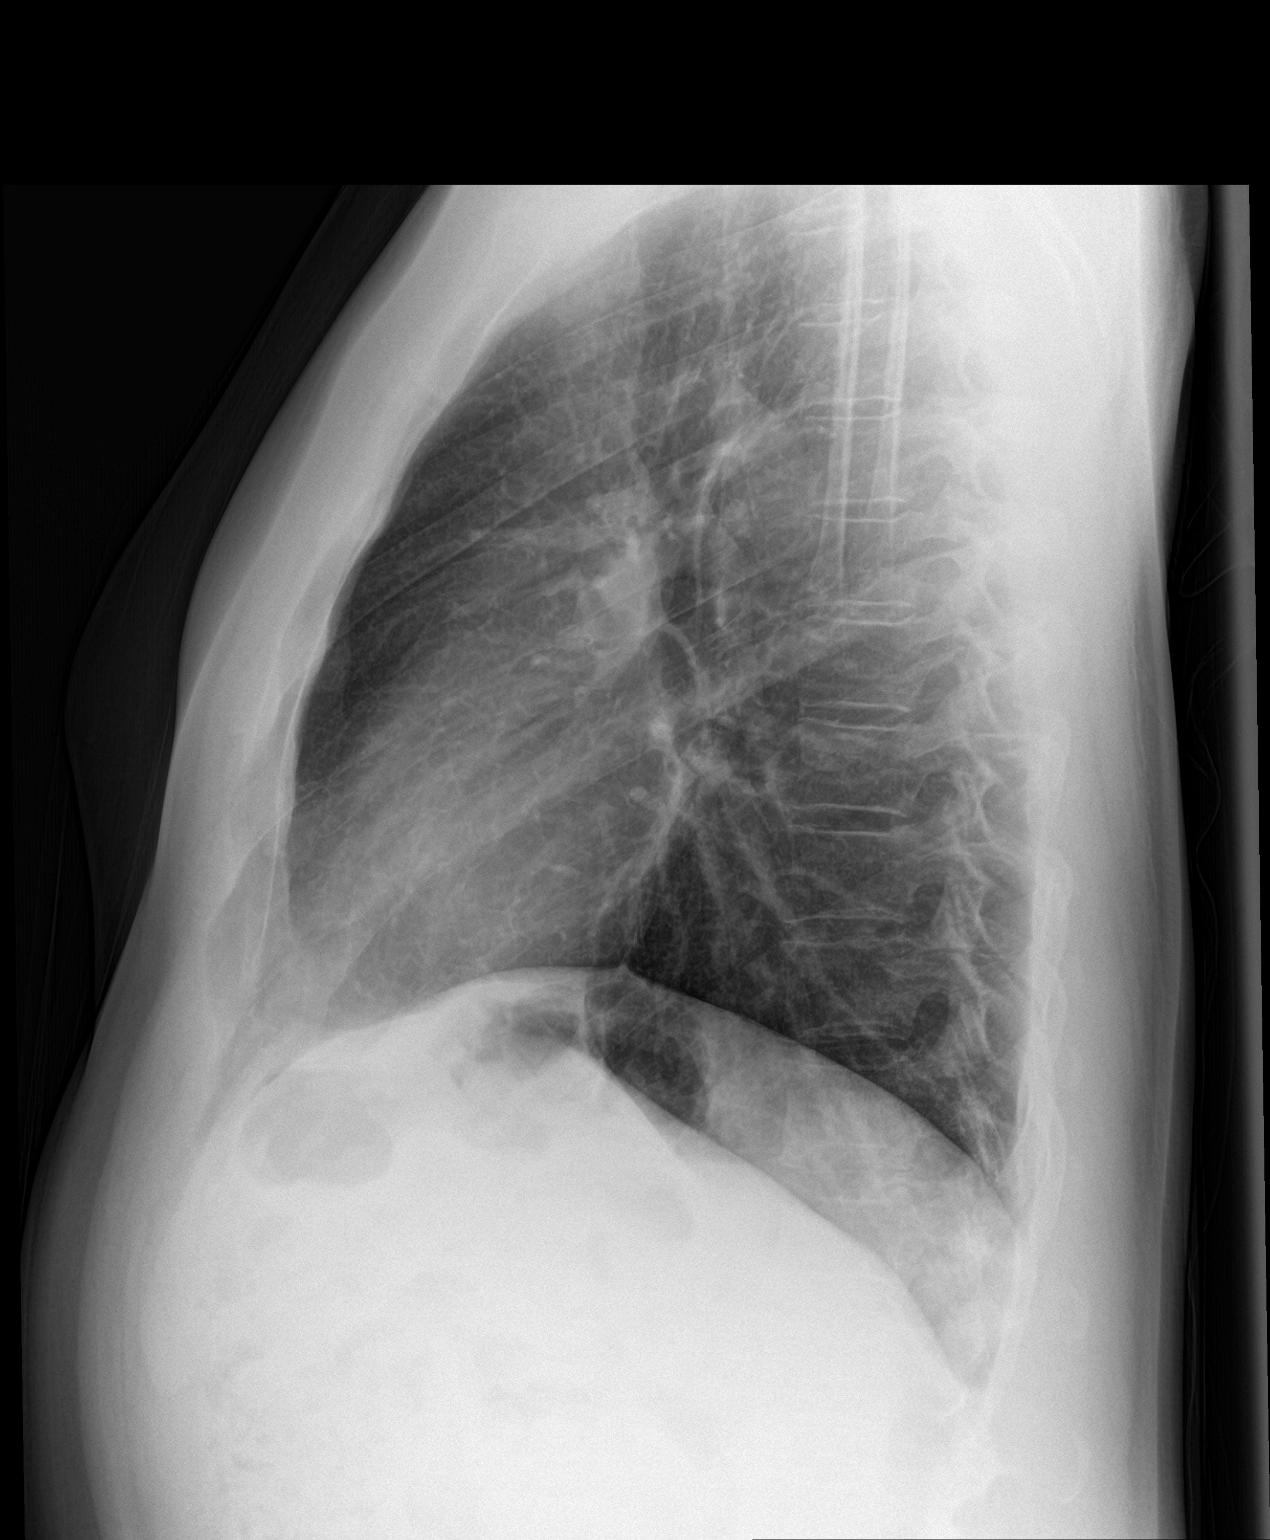

[2 of 2 positions shown; findings below may reference images not displayed]

FINDINGS: The cardiomediastinal silhouette is within normal limits. The lungs
are well inflated and clear. There is no evidence of pleural
effusion or pneumothorax. No acute osseous abnormality is
identified.
IMPRESSION: No active cardiopulmonary disease.

## 2018-09-02 ENCOUNTER — Encounter (HOSPITAL_BASED_OUTPATIENT_CLINIC_OR_DEPARTMENT_OTHER): Payer: Self-pay | Admitting: Adult Health

## 2018-09-02 ENCOUNTER — Emergency Department (HOSPITAL_BASED_OUTPATIENT_CLINIC_OR_DEPARTMENT_OTHER): Payer: 59

## 2018-09-02 ENCOUNTER — Other Ambulatory Visit: Payer: Self-pay

## 2018-09-02 ENCOUNTER — Emergency Department (HOSPITAL_BASED_OUTPATIENT_CLINIC_OR_DEPARTMENT_OTHER)
Admission: EM | Admit: 2018-09-02 | Discharge: 2018-09-02 | Disposition: A | Discharge status: Home / Self Care | Payer: 59 | Attending: Emergency Medicine | Admitting: Emergency Medicine

## 2018-09-02 DIAGNOSIS — R0789 Other chest pain: Secondary | ICD-10-CM | POA: Diagnosis not present

## 2018-09-02 DIAGNOSIS — Z79899 Other long term (current) drug therapy: Secondary | ICD-10-CM | POA: Insufficient documentation

## 2018-09-02 DIAGNOSIS — R079 Chest pain, unspecified: Secondary | ICD-10-CM | POA: Diagnosis not present

## 2018-09-02 DIAGNOSIS — R0602 Shortness of breath: Secondary | ICD-10-CM | POA: Diagnosis not present

## 2018-09-02 DIAGNOSIS — Z87891 Personal history of nicotine dependence: Secondary | ICD-10-CM | POA: Diagnosis not present

## 2018-09-02 LAB — CBC
HEMATOCRIT: 37.4 % — AB (ref 39.0–52.0)
HEMOGLOBIN: 13.5 g/dL (ref 13.0–17.0)
MCH: 29.2 pg (ref 26.0–34.0)
MCHC: 36.1 g/dL — AB (ref 30.0–36.0)
MCV: 80.8 fL (ref 78.0–100.0)
Platelets: 191 10*3/uL (ref 150–400)
RBC: 4.63 MIL/uL (ref 4.22–5.81)
RDW: 12.7 % (ref 11.5–15.5)
WBC: 5 10*3/uL (ref 4.0–10.5)

## 2018-09-02 LAB — BASIC METABOLIC PANEL
Anion gap: 8 (ref 5–15)
BUN: 11 mg/dL (ref 6–20)
CO2: 25 mmol/L (ref 22–32)
Calcium: 8.8 mg/dL — ABNORMAL LOW (ref 8.9–10.3)
Chloride: 107 mmol/L (ref 98–111)
Creatinine, Ser: 0.94 mg/dL (ref 0.61–1.24)
GFR calc Af Amer: >60 mL/min (ref >60)
GLUCOSE: 121 mg/dL — AB (ref 70–99)
POTASSIUM: 3.9 mmol/L (ref 3.5–5.1)
Sodium: 140 mmol/L (ref 135–145)

## 2018-09-02 LAB — TROPONIN I
Troponin I: <0.03 ng/mL (ref <0.03)
Troponin I: <0.03 ng/mL (ref <0.03)

## 2018-09-02 NOTE — ED Provider Notes (Signed)
I received pt in signout from Dr. Maryan Rued. We were awaiting 3-hour  Troponin, initial trop and EKG reassuring. 2nd trop negative. PT well appearing on reassessment. His HEAR score is 1 therefore he is appropriate for outpatient w/u given reassuring w/u here. Discussed return precautions and f/u plan.   Elivia Robotham, Wenda Over[place], MD 09/02/18 [phone]

## 2018-09-02 NOTE — ED Provider Notes (Signed)
[facility] Provider Note   CSN: [id number] Arrival date & time: 09/02/18  1231     History   Chief Complaint Chief Complaint  Patient presents with  . Chest Pain    HPI Rodney Harris is a 42 y.o. male.  Patient is a 42 year old male with a history of hard to control acid reflux and hernia who is presenting today with ongoing chest discomfort.  Patient states that when he woke up this morning he felt okay but around 8:00 or so this morning started to develop chest discomfort in the center of his chest.  He states the pain worsened with exertion and got better with rest.  The worst was when he tried to go up the stairs shortly before arrival here which made him short of breath, diaphoretic and some pain in his shoulder which he is not sure is related to the chest pain.  He states he does have shoulder pain quite regularly.  2 days ago patient did develop a cough congestion and asthma type symptoms.  He did have to use his inhaler on Wednesday which helped.  He denies a productive cough, unilateral leg pain or swelling.  He does not smoke cigarettes or drink alcohol excessively.  He has had no recent travel.  He has been under a lot more stress lately with his job and is planning on traveling to [place] next week for work.  He denies a family history of sudden cardiac death at an early age.  2 grandparents with MI in their 9s but his parents and his siblings with no cardiac disease.  He denies a history of diabetes, hypertension or hyperlipidemia.  He was prescribed malarone for his trip to [place] but has not started taking it yet.  Currently he states his pain is almost completely gone and he is not feeling short of breath  The history is provided by the patient.    Past Medical History:  Diagnosis Date  . Acid reflux   . Hernia     Patient Active Problem List   Diagnosis Date Noted  . GERD (gastroesophageal reflux disease) 07/13/2016    Past  Surgical History:  Procedure Laterality Date  . HERNIA REPAIR     1982  . HERNIA REPAIR     2011  . WISDOM TOOTH EXTRACTION          Home Medications    Prior to Admission medications   Medication Sig Start Date End Date Taking? Authorizing Provider  albuterol (PROVENTIL HFA;VENTOLIN HFA) 108 (90 Base) MCG/ACT inhaler Inhale 2 puffs into the lungs every 6 (six) hours as needed for wheezing or shortness of breath. 12/16/17   Shelda Pal, DO  atovaquone-proguanil (MALARONE) 250-100 MG TABS tablet Take 1 tablet by mouth daily. Start 2 days prior to travel and continue for one week once home 08/29/18   Copland, Gay Filler, MD  fexofenadine (ALLEGRA) 60 MG tablet Take 60 mg by mouth 2 (two) times daily as needed.     [provider]  ranitidine (ZANTAC) 150 MG capsule Take 150 mg by mouth 2 (two) times daily.    [provider]  sucralfate (CARAFATE) 1 g tablet Take 1 tablet (1 g total) by mouth 3 (three) times daily. 07/19/18   Doran Stabler, MD    Family History Family History  Problem Relation Age of Onset  . Breast cancer Mother   . Hypertension Father   . Arthritis Maternal Grandmother   .  Hypertension Maternal Grandmother   . Arthritis Maternal Grandfather   . Lung cancer Maternal Grandfather   . Hypertension Maternal Grandfather   . Alcoholism Paternal Grandmother   . Arthritis Paternal Grandmother   . Breast cancer Paternal Grandmother   . Hypertension Paternal Grandmother   . Alcoholism Paternal Grandfather   . Arthritis Paternal Grandfather   . Stroke Paternal Grandfather   . Hypertension Paternal Grandfather   . Diabetes Paternal Grandfather   . Colon cancer Neg Hx   . Esophageal cancer Neg Hx   . Stomach cancer Neg Hx   . Rectal cancer Neg Hx     Social History Social History   Tobacco Use  . Smoking status: Former Smoker    Last attempt to quit: 12/07/2013    Years since quitting: 4.7  . Smokeless tobacco: Never Used    Substance Use Topics  . Alcohol use: Yes    Comment: occassionally  . Drug use: No     Allergies   Bee venom; Molds & smuts; Other; Pollen extract; and Sulfa antibiotics   Review of Systems Review of Systems  All other systems reviewed and are negative.    Physical Exam Updated Vital Signs BP 120/86 (BP Location: Right Arm)   Pulse 60   Temp 97.6 F (36.4 C) (Oral)   Resp 18   Ht 6' 1"  (1.854 m)   Wt 84.8 kg   SpO2 100%   BMI 24.67 kg/m   Physical Exam  Constitutional: He is oriented to person, place, and time. He appears well-developed and well-nourished. No distress.  HENT:  Head: Normocephalic and atraumatic.  Mouth/Throat: Oropharynx is clear and moist.  Eyes: Pupils are equal, round, and reactive to light. Conjunctivae and EOM are normal.  Neck: Normal range of motion. Neck supple.  Cardiovascular: Normal rate, regular rhythm and intact distal pulses.  No murmur heard. Pulmonary/Chest: Effort normal and breath sounds normal. No respiratory distress. He has no wheezes. He has no rales.  Abdominal: Soft. He exhibits no distension. There is tenderness in the epigastric area. There is no rebound and no guarding.    Musculoskeletal: Normal range of motion. He exhibits no edema or tenderness.  Neurological: He is alert and oriented to person, place, and time.  Skin: Skin is warm and dry. Capillary refill takes less than 2 seconds. No rash noted. No erythema.  Psychiatric: He has a normal mood and affect. His behavior is normal.  Nursing note and vitals reviewed.    ED Treatments / Results  Labs (all labs ordered are listed, but only abnormal results are displayed) Labs Reviewed  BASIC METABOLIC PANEL - Abnormal; Notable for the following components:      Result Value   Glucose, Bld 121 (*)    Calcium 8.8 (*)    All other components within normal limits  CBC - Abnormal; Notable for the following components:   HCT 37.4 (*)    MCHC 36.1 (*)    All other  components within normal limits  TROPONIN I    EKG EKG Interpretation  Date/Time:  Friday September 02 2018 12:36:23 EDT Ventricular Rate:  63 PR Interval:    QRS Duration: 90 QT Interval:  419 QTC Calculation: 429 R Axis:   66 Text Interpretation:  Sinus rhythm RSR' in V1 or V2, probably normal variant No significant change since last tracing Confirmed by Blanchie Dessert [phone]) on 09/02/2018 12:58:00 PM   Radiology Dg Chest 2 View  Result Date: 09/02/2018 CLINICAL DATA:  Shortness of breath and chest pain EXAM: CHEST - 2 VIEW COMPARISON:  December 29, 2017 FINDINGS: The lungs are clear. The heart size and pulmonary vascularity are normal. No adenopathy. No pneumothorax. No bone lesions. IMPRESSION: No edema or consolidation. Electronically Signed   By: Lowella Grip III M.D.   On: 09/02/2018 13:20    Procedures Procedures (including critical care time)  Medications Ordered in ED Medications - No data to display   Initial Impression / Assessment and Plan / ED Course  I have reviewed the triage vital signs and the nursing notes.  Pertinent labs & imaging results that were available during my care of the patient were reviewed by me and considered in my medical decision making (see chart for details).   Patient is a 42 year old male presenting today with chest pain that is in the center of his chest and worse with exertion.  This is in the setting of 3 months of severe GERD that has been hard to control with medication as well as increased stress with his job.  Also 2 days ago he started to develop cough and congestion and has a history of asthma.  He has had no recent travel and denies any unilateral chest pain or swelling.  PERC neg. patient symptoms are not classic for dissection, PE or acute abdomen abdominal perforated viscus.  Patient's EKG is within normal limits.  CBC, BMP and troponin all within normal limits.  Chest x-ray without acute findings.  Suspect most likely  patient's symptoms are related to something GI in nature and lower suspicion for ACS.  Patient's heart score is 1 for moderately suspicious story.  Will get a delta troponin to ensure no elevation.  Offered patient a GI cocktail but he states the pain is and was completely gone at this time.  So patient does travel out of the country multiple times per year and concern for possible H pylori as the cause of his persistently difficult to treat GERD.  He has follow-up with Dr. Edilia Bo on Monday and recommend he mentioned that.    Final Clinical Impressions(s) / ED Diagnoses   Final diagnoses:  None    ED Discharge Orders    None       Blanchie Dessert, MD 09/04/18 2036

## 2018-09-02 NOTE — ED Triage Notes (Signed)
Presents with sternal chest pressure that began this AM , it is constant and radiates to left shoulder. Sitting makes it better. Exertion makes it worse. PAin is associated with cold sweats, SOB , nausea and dizziness. HE denies Hx of hyperlipidemia and hypertension and DM. Denies familial history of MI young.

## 2018-09-05 ENCOUNTER — Encounter: Payer: Self-pay | Admitting: Medical

## 2018-09-05 ENCOUNTER — Ambulatory Visit: Payer: 59 | Admitting: Medical

## 2018-09-05 VITALS — BP 123/68 | HR 70 | Temp 97.9°F | Resp 16 | Ht 73.0 in | Wt 198.8 lb

## 2018-09-05 DIAGNOSIS — Z23 Encounter for immunization: Secondary | ICD-10-CM

## 2018-09-05 DIAGNOSIS — K219 Gastro-esophageal reflux disease without esophagitis: Secondary | ICD-10-CM | POA: Diagnosis not present

## 2018-09-05 DIAGNOSIS — R0789 Other chest pain: Secondary | ICD-10-CM
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
## 2018-09-06 LAB — H. PYLORI BREATH TEST: H. pylori Breath Test: NOT DETECTED
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
## 2018-09-22 ENCOUNTER — Ambulatory Visit: Payer: 59 | Admitting: Family Medicine

## 2018-09-22 ENCOUNTER — Encounter: Payer: Self-pay | Admitting: Family Medicine

## 2018-09-22 VITALS — BP 122/68 | HR 71 | Temp 97.6°F | Resp 16 | Ht 73.0 in | Wt 195.0 lb

## 2018-09-22 DIAGNOSIS — K219 Gastro-esophageal reflux disease without esophagitis: Secondary | ICD-10-CM

## 2018-09-22 DIAGNOSIS — F411 Generalized anxiety disorder: Secondary | ICD-10-CM

## 2018-09-22 MED ORDER — FLUOXETINE HCL 20 MG PO TABS: 20.0000 mg | ORAL_TABLET | Freq: Every day | ORAL | 9 refills | Status: DC

## 2018-09-22 MED ORDER — PANTOPRAZOLE SODIUM 20 MG PO TBEC: 20.0000 mg | DELAYED_RELEASE_TABLET | Freq: Every day | ORAL | 9 refills | Status: DC

## 2018-09-22 NOTE — Patient Instructions (Addendum)
I am sorry that things are so stressful right now!  For your GERD issues, let's try adding protonix 20 mg once a day You can continue ranitidine for the time being- in a couple of weeks try stopping it and see how you do   For anxiety, let's try adding prozac 20 mg- take this once a day Please let me know how this does for you in about one month- if you are getting worse or have any other concerns let me know sooner.  We may want to increase this dose to 40 mg in a month or so depending on your symptoms

## 2018-11-13 ENCOUNTER — Other Ambulatory Visit: Payer: Self-pay | Admitting: Family Medicine

## 2018-11-13 DIAGNOSIS — K219 Gastro-esophageal reflux disease without esophagitis: Secondary | ICD-10-CM

## 2018-11-13 DIAGNOSIS — F411 Generalized anxiety disorder: Secondary | ICD-10-CM

## 2019-02-14 ENCOUNTER — Other Ambulatory Visit: Payer: Self-pay | Admitting: Family Medicine

## 2019-02-14 DIAGNOSIS — J40 Bronchitis, not specified as acute or chronic: Secondary | ICD-10-CM

## 2019-02-22 ENCOUNTER — Telehealth: Payer: 59 | Admitting: Family

## 2019-02-22 DIAGNOSIS — R6889 Other general symptoms and signs: Secondary | ICD-10-CM

## 2019-02-22 DIAGNOSIS — R05 Cough: Secondary | ICD-10-CM

## 2019-02-22 DIAGNOSIS — R059 Cough, unspecified: Secondary | ICD-10-CM

## 2019-02-22 NOTE — Progress Notes (Signed)
E-Visit for Corona Virus Screening Based on your current symptoms, it seems like that your symptoms may related to the [place] virus.   Very important to self isolate and avoid contact with anyone 65 years or older.   As discussed on the phone, lots of rest, force fluids, and tylenol or motrin as needed. Pt is not have active SOB, but SOB with activity.   Approximately 5 minutes was spent documenting and reviewing patient's chart.    Coronavirus disease 2019 (COVID-19) is a respiratory illness that can spread from person to person. The virus that causes COVID-19 is a new virus that was first identified in the country of [place] but is now found in multiple other countries and has spread to the [place].  Symptoms associated with the virus are mild to severe fever, cough, and shortness of breath. There is currently no vaccine to protect against COVID-19, and there is no specific antiviral treatment for the virus.  It is vitally important that if you feel that you have an infection such as this virus or any other virus that you stay home and away from places where you may spread it to others.  Currently, not all patients are being tested. If the symptoms are mild and there is not a known exposure, performing the test is not indicated.  Reduce your risk of any infection by using the same precautions used for avoiding the common cold or flu:  Marland Kitchen Wash your hands often with soap and warm water for at least 20 seconds.  If soap and water are not readily available, use an alcohol-based hand sanitizer with at least 60% alcohol.  . If coughing or sneezing, cover your mouth and nose by coughing or sneezing into the elbow areas of your shirt or coat, into a tissue or into your sleeve (not your hands). . Avoid shaking hands with others and consider head nods or verbal greetings only. . Avoid touching your eyes, nose, or mouth with unwashed hands.  . Avoid close contact with people who are sick. . Avoid  places or events with large numbers of people in one location, like concerts or sporting events. . Carefully consider travel plans you have or are making. . If you are planning any travel outside or inside the [place], visit the CDC's Travelers' Health webpage for the latest health notices. . If you have some symptoms but not all symptoms, continue to monitor at home and seek medical attention if your symptoms worsen. . If you are having a medical emergency, call 911.  HOME CARE . Only take medications as instructed by your medical team. . Drink plenty of fluids and get plenty of rest. . A steam or ultrasonic humidifier can help if you have congestion.   GET HELP RIGHT AWAY IF: . You develop worsening fever. . You become short of breath . You cough up blood. . Your symptoms become more severe MAKE SURE YOU   Understand these instructions.  Will watch your condition.  Will get help right away if you are not doing well or get worse.  Your e-visit answers were reviewed by a board certified advanced clinical practitioner to complete your personal care plan.  Depending on the condition, your plan could have included both over the counter or prescription medications.  If there is a problem please reply once you have received a response from your provider. Your safety is important to [place].  If you have drug allergies check your prescription carefully.  You can use MyChart to ask questions about today's visit, request a non-urgent call back, or ask for a work or school excuse for 24 hours related to this e-Visit. If it has been greater than 24 hours you will need to follow up with your provider, or enter a new e-Visit to address those concerns. You will get an e-mail in the next two days asking about your experience.  I hope that your e-visit has been valuable and will speed your recovery. Thank you for using e-visits.

## 2019-03-09 ENCOUNTER — Encounter: Payer: Self-pay | Admitting: Family Medicine

## 2019-03-10 ENCOUNTER — Other Ambulatory Visit: Payer: Self-pay | Admitting: Family Medicine

## 2019-07-10 NOTE — Progress Notes (Addendum)
[place] at [facility] [address], [place], [place] [address] [phone]  Date:  07/12/2019   Name:  Rodney Harris   DOB:  March 14, 1976   MRN:  [id number]  PCP:  Darreld Mclean, MD    Chief Complaint: Annual Exam   History of Present Illness:  Rodney Harris is a 43 y.o. very pleasant male patient who presents with the following:  Generally healthy man here today for a CPE Last seen by myself in the fall for GERD He had a new baby in December - they had a girl! Also has a teenage child who is 28 and a 36yo Rides bikes for exercise - this is a good outlet and source of exercise for him He also coaches the older girls in lacrosse   He has noted some SOB for the first 20 minutes or so of outdoor cycline.  Has noted this since it got hot in June- occurs on a consistent basis No wheezing noted He may have had mild asthma as a child  He was on advair in the past but is not using this currently  He has not tried his albuterol for this although he does have some at home  He has the SOB every time he rides Never any CP or pressure however  He does have some intermittent allergy sx at baseline- not taking his allegra right now He is a former smoker but quit several years ago Never had SOB indoors with stairs, etc   He also will have some swelling of his LEFT leg only at the end of the day if he sits a lot  Has noted this for a couple of months  Wearing compression socks helps   From our last visit:  He has had upper GI and negative H pylori testing He is modifying his diet and taking H2 blocker Will add PPI and he will try stopping H2 in a couple of weeks Admits to a lot of stress right now with both work and family No SI, but he does admit to having anxiety about 50% of the time He would like to try treating this rx for prozac 20 mg- he will contact me in about one month with an update  Needs labs today His  stomach is doing better He is now using protonix off and on prn He did not end up taking the prozac but feels like he is doing ok without it  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 GERD (gastroesophageal reflux disease) 07/13/2016    Past Medical History:  Diagnosis Date  . Acid reflux   . Hernia     Past Surgical History:  Procedure Laterality Date  . HERNIA REPAIR     1982  . HERNIA REPAIR     2011  . [place] EXTRACTION      Social History  Tobacco Use  . Smoking status: Former Smoker    Quit date: 12/07/2013    Years since quitting: 5.6  . Smokeless tobacco: Never Used  Substance Use Topics  . Alcohol use: Yes    Comment: occassionally  . Drug use: No    Family History  Problem Relation Age of Onset  . Breast cancer Mother   . Hypertension Father   . Arthritis Maternal Grandmother   . Hypertension Maternal Grandmother   . Arthritis Maternal Grandfather   . Lung cancer Maternal Grandfather   . Hypertension Maternal Grandfather   . Alcoholism Paternal Grandmother   . Arthritis Paternal Grandmother   . Breast cancer Paternal Grandmother   . Hypertension Paternal Grandmother   . Alcoholism Paternal Grandfather   . Arthritis Paternal Grandfather   . Stroke Paternal Grandfather   . Hypertension Paternal Grandfather   . Diabetes Paternal Grandfather   . Colon cancer Neg Hx   . Esophageal cancer Neg Hx   . Stomach cancer Neg Hx   . Rectal cancer Neg Hx     Allergies  Allergen Reactions  . Bee Venom Anaphylaxis  . Molds &  Smuts Other (See Comments)  . Other Other (See Comments)  . Pollen Extract Other (See Comments)  . Sulfa Antibiotics Hives    Medication list has been reviewed and updated.  Current Outpatient Medications on File Prior to Visit  Medication Sig Dispense Refill  . EPINEPHrine 0.3 mg/0.3 mL IJ SOAJ injection INJECT 0.3 MLS (0.3 MG TOTAL) INTO THE MUSCLE ONCE FOR 1 DOSE. FOR ALLERGIC REACTION  99  . fexofenadine (ALLEGRA) 60 MG tablet Take 60 mg by mouth 2 (two) times daily as needed.     Marland Kitchen PROAIR HFA 108 (90 Base) MCG/ACT inhaler TAKE 2 PUFFS BY MOUTH EVERY 6 HOURS AS NEEDED FOR WHEEZE OR SHORTNESS OF BREATH 8.5 Inhaler 0  . ranitidine (ZANTAC) 150 MG capsule Take 150 mg by mouth 2 (two) times daily.    . sucralfate (CARAFATE) 1 g tablet Take 1 tablet (1 g total) by mouth 3 (three) times daily. 60 tablet 2   No current facility-administered medications on file prior to visit.     Review of Systems:  As per HPI- otherwise negative. No fever or chills  Physical Examination: Vitals:   07/12/19 0941  BP: 120/80  Pulse: (!) 56  Resp: 16  Temp: 97.9 F (36.6 C)  SpO2: 97%   Vitals:   07/12/19 0941  Weight: 192 lb (87.1 kg)  Height: 6' 1"  (1.854 m)   Body mass index is 25.33 kg/m. Ideal Body Weight: Weight in (lb) to have BMI = 25: 189.1  GEN: WDWN, NAD, Non-toxic, A & O x 3, normal weight, looks well  HEENT: Atraumatic, Normocephalic. Neck supple. No masses, No LAD.  Bilateral TM wnl, oropharynx normal.  PEERL,EOMI.   Ears and Nose: No external deformity. CV: RRR, No M/G/R. No JVD. No thrill. No extra heart sounds. PULM: CTA B, no wheezes, crackles, rhonchi. No retractions. No resp. distress. No accessory muscle use. ABD: S, NT, ND, +BS. No rebound. No HSM. EXTR: No c/c/e.  No swelling of lower extremities present at this time NEURO Normal gait.  PSYCH: Normally interactive. Conversant. Not depressed or anxious appearing.  Calm demeanor.   MAU:QJFHL brady with rate of 53-  wnl Compared to tracing from a year ago no significant change   Assessment and Plan:   ICD-10-CM   1. Physical exam  Z00.00   2. GAD (  generalized anxiety disorder)  F41.1   3. Screening for diabetes mellitus  Z13.1 Comprehensive metabolic panel    Hemoglobin A1c  4. Screening for hyperlipidemia  Z13.220 Lipid panel  5. Screening for deficiency anemia  Z13.0 CBC  6. Screening for HIV (human immunodeficiency virus)  Z11.4 HIV Antibody (routine testing w rflx)  7. SOB (shortness of breath) on exertion  R06.02 D-Dimer, Quantitative    EKG 12-Lead    beclomethasone (QVAR REDIHALER) 40 MCG/ACT inhaler  8. Gastroesophageal reflux disease without esophagitis  K21.9 pantoprazole (PROTONIX) 20 MG tablet   CPE today Refilled his PPI for prn use Labs pending as above IUTD- tdap in 2015 He is doing high mileage outdoor cycling for exercise.  No chest pain or tightness, but he may notice shortness of breath.  He does not have any shortness of breath when being active indoors.  Suspect his symptoms are likely related to history of asthma.  We will have him start on albuterol preexercise, and also add Qvar. However, have cautioned him that if symptoms do not resolve we will plan for a stress test He also has noted some swelling in his left leg, will go ahead and do a d-dimer today  Follow-up: No follow-ups on file.  Meds ordered this encounter  Medications  . beclomethasone (QVAR REDIHALER) 40 MCG/ACT inhaler    Sig: Inhale 1-2 puffs into the lungs 2 (two) times daily.    Dispense:  10.6 g    Refill:  6  . pantoprazole (PROTONIX) 20 MG tablet    Sig: Take 1 tablet (20 mg total) by mouth daily. Use as needed    Dispense:  90 tablet    Refill:  2   Orders Placed This Encounter  Procedures  . CBC  . Comprehensive metabolic panel  . Hemoglobin A1c  . Lipid panel  . HIV Antibody (routine testing w rflx)  . D-Dimer, Quantitative  . EKG 12-Lead    @SIGN @    Signed Lamar Blinks,  MD  Received his labs as below, message to patient  Results for orders placed or performed in visit on 07/12/19  CBC  Result Value Ref Range   WBC 4.3 4.0 - 10.5 K/uL   RBC 4.88 4.22 - 5.81 Mil/uL   Platelets 216.0 150.0 - 400.0 K/uL   Hemoglobin 14.3 13.0 - 17.0 g/dL   HCT 42.2 39.0 - 52.0 %   MCV 86.5 78.0 - 100.0 fl   MCHC 33.9 30.0 - 36.0 g/dL   RDW 13.4 11.5 - 15.5 %  Comprehensive metabolic panel  Result Value Ref Range   Sodium 138 135 - 145 mEq/L   Potassium 4.1 3.5 - 5.1 mEq/L   Chloride 103 96 - 112 mEq/L   CO2 29 19 - 32 mEq/L   Glucose, Bld 88 70 - 99 mg/dL   BUN 19 6 - 23 mg/dL   Creatinine, Ser 0.97 0.40 - 1.50 mg/dL   Total Bilirubin 0.8 0.2 - 1.2 mg/dL   Alkaline Phosphatase 50 39 - 117 U/L   AST 20 0 - 37 U/L   ALT 19 0 - 53 U/L   Total Protein 7.0 6.0 - 8.3 g/dL   Albumin 4.7 3.5 - 5.2 g/dL   Calcium 9.4 8.4 - 10.5 mg/dL   GFR 83.69 >60.00 mL/min  Hemoglobin A1c  Result Value Ref Range   Hgb A1c MFr Bld 5.2 4.6 - 6.5 %  Lipid panel  Result Value Ref Range   Cholesterol 197 0 -  200 mg/dL   Triglycerides 73.0 0.0 - 149.0 mg/dL   HDL 57.20 >39.00 mg/dL   VLDL 14.6 0.0 - 40.0 mg/dL   LDL Cholesterol 126 (H) 0 - 99 mg/dL   Total CHOL/HDL Ratio 3    NonHDL 140.23   D-Dimer, Quantitative  Result Value Ref Range   D-Dimer, Quant 0.43 <0.50 mcg/mL FEU   Received D dimer 8/6- message to pt

## 2019-07-12 ENCOUNTER — Encounter: Payer: Self-pay | Admitting: Family Medicine

## 2019-07-12 ENCOUNTER — Ambulatory Visit (INDEPENDENT_AMBULATORY_CARE_PROVIDER_SITE_OTHER): Payer: 59 | Admitting: Family Medicine

## 2019-07-12 ENCOUNTER — Other Ambulatory Visit: Payer: Self-pay

## 2019-07-12 VITALS — BP 120/80 | HR 56 | Temp 97.9°F | Resp 16 | Ht 73.0 in | Wt 192.0 lb

## 2019-07-12 DIAGNOSIS — Z131 Encounter for screening for diabetes mellitus: Secondary | ICD-10-CM | POA: Diagnosis not present

## 2019-07-12 DIAGNOSIS — F411 Generalized anxiety disorder: Secondary | ICD-10-CM | POA: Diagnosis not present

## 2019-07-12 DIAGNOSIS — Z13220 Encounter for screening for lipoid disorders: Secondary | ICD-10-CM | POA: Diagnosis not present

## 2019-07-12 DIAGNOSIS — Z0000 Encounter for general adult medical examination without abnormal findings: Secondary | ICD-10-CM | POA: Diagnosis not present

## 2019-07-12 DIAGNOSIS — R0602 Shortness of breath: Secondary | ICD-10-CM

## 2019-07-12 DIAGNOSIS — Z130 Encounter for screening for diseases of the blood and blood-forming organs and certain disorders involving the immune mechanism: Secondary | ICD-10-CM

## 2019-07-12 DIAGNOSIS — Z114 Encounter for screening for human immunodeficiency virus [HIV]: Secondary | ICD-10-CM

## 2019-07-12 DIAGNOSIS — K219 Gastro-esophageal reflux disease without esophagitis: Secondary | ICD-10-CM

## 2019-07-12 LAB — CBC
HCT: 42.2 % (ref 39.0–52.0)
Hemoglobin: 14.3 g/dL (ref 13.0–17.0)
MCHC: 33.9 g/dL (ref 30.0–36.0)
MCV: 86.5 fl (ref 78.0–100.0)
Platelets: 216 10*3/uL (ref 150.0–400.0)
RBC: 4.88 Mil/uL (ref 4.22–5.81)
RDW: 13.4 % (ref 11.5–15.5)
WBC: 4.3 10*3/uL (ref 4.0–10.5)

## 2019-07-12 LAB — COMPREHENSIVE METABOLIC PANEL
ALT: 19 U/L (ref 0–53)
AST: 20 U/L (ref 0–37)
Albumin: 4.7 g/dL (ref 3.5–5.2)
Alkaline Phosphatase: 50 U/L (ref 39–117)
BUN: 19 mg/dL (ref 6–23)
CO2: 29 mEq/L (ref 19–32)
Calcium: 9.4 mg/dL (ref 8.4–10.5)
Chloride: 103 mEq/L (ref 96–112)
Creatinine, Ser: 0.97 mg/dL (ref 0.40–1.50)
GFR: 83.69 mL/min (ref >60.00)
Glucose, Bld: 88 mg/dL (ref 70–99)
Potassium: 4.1 mEq/L (ref 3.5–5.1)
Sodium: 138 mEq/L (ref 135–145)
Total Bilirubin: 0.8 mg/dL (ref 0.2–1.2)
Total Protein: 7 g/dL (ref 6.0–8.3)

## 2019-07-12 LAB — LIPID PANEL
Cholesterol: 197 mg/dL (ref 0–200)
HDL: 57.2 mg/dL (ref >39.00)
LDL Cholesterol: 126 mg/dL — ABNORMAL HIGH (ref 0–99)
NonHDL: 140.23
Total CHOL/HDL Ratio: 3
Triglycerides: 73 mg/dL (ref 0.0–149.0)
VLDL: 14.6 mg/dL (ref 0.0–40.0)

## 2019-07-12 LAB — HEMOGLOBIN A1C: Hgb A1c MFr Bld: 5.2 % (ref 4.6–6.5)

## 2019-07-12 MED ORDER — PANTOPRAZOLE SODIUM 20 MG PO TBEC: 20.0000 mg | DELAYED_RELEASE_TABLET | Freq: Every day | ORAL | 2 refills | Status: DC

## 2019-07-12 MED ORDER — QVAR REDIHALER 40 MCG/ACT IN AERB: 1.0000 | INHALATION_SPRAY | Freq: Two times a day (BID) | RESPIRATORY_TRACT | 6 refills | Status: DC

## 2019-07-12 NOTE — Patient Instructions (Addendum)
Good to see you today and congratulations on your new baby!  I will be in touch with your labs asap We will do a D dimer test to look for any suggestion of a blood clot- if positive plan to proceed with [place] of your leg and perhaps CT chest  Otherwise, pleas try using your albuterol prior to exercise I will also have you use an inhaled steroid daily for a month or so - Qvar.  Take 1-2 puffs twice a day regardless of symptoms  Please let me know how this works for you- please update me within a week, sooner if worse    Health Maintenance, Male Adopting a healthy lifestyle and getting preventive care are important in promoting health and wellness. Ask your health care provider about:  The right schedule for you to have regular tests and exams.  Things you can do on your own to prevent diseases and keep yourself healthy. What should I know about diet, weight, and exercise? Eat a healthy diet   Eat a diet that includes plenty of vegetables, fruits, low-fat dairy products, and lean protein.  Do not eat a lot of foods that are high in solid fats, added sugars, or sodium. Maintain a healthy weight Body mass index (BMI) is a measurement that can be used to identify possible weight problems. It estimates body fat based on height and weight. Your health care provider can help determine your BMI and help you achieve or maintain a healthy weight. Get regular exercise Get regular exercise. This is one of the most important things you can do for your health. Most adults should:  Exercise for at least 150 minutes each week. The exercise should increase your heart rate and make you sweat (moderate-intensity exercise).  Do strengthening exercises at least twice a week. This is in addition to the moderate-intensity exercise.  Spend less time sitting. Even light physical activity can be beneficial. Watch cholesterol and blood lipids Have your blood tested for lipids and cholesterol at 45 years of age,  then have this test every 5 years. You may need to have your cholesterol levels checked more often if:  Your lipid or cholesterol levels are high.  You are older than 45 years of age.  You are at high risk for heart disease. What should I know about cancer screening? Many types of cancers can be detected early and may often be prevented. Depending on your health history and family history, you may need to have cancer screening at various ages. This may include screening for:  Colorectal cancer.  Prostate cancer.  Skin cancer.  Lung cancer. What should I know about heart disease, diabetes, and high blood pressure? Blood pressure and heart disease  High blood pressure causes heart disease and increases the risk of stroke. This is more likely to develop in people who have high blood pressure readings, are of African descent, or are overweight.  Talk with your health care provider about your target blood pressure readings.  Have your blood pressure checked: ? Every 3-5 years if you are 46-4 years of age. ? Every year if you are 19 years old or older.  If you are between the ages of 45 and 72 and are a current or former smoker, ask your health care provider if you should have a one-time screening for abdominal aortic aneurysm (AAA). Diabetes Have regular diabetes screenings. This checks your fasting blood sugar level. Have the screening done:  Once every three years after age 62  if you are at a normal weight and have a low risk for diabetes.  More often and at a younger age if you are overweight or have a high risk for diabetes. What should I know about preventing infection? Hepatitis B If you have a higher risk for hepatitis B, you should be screened for this virus. Talk with your health care provider to find out if you are at risk for hepatitis B infection. Hepatitis C Blood testing is recommended for:  Everyone born from [birth year] through 1967.  Anyone with known risk factors  for hepatitis C. Sexually transmitted infections (STIs)  You should be screened each year for STIs, including gonorrhea and chlamydia, if: ? You are sexually active and are younger than 45 years of age. ? You are older than 45 years of age and your health care provider tells you that you are at risk for this type of infection. ? Your sexual activity has changed since you were last screened, and you are at increased risk for chlamydia or gonorrhea. Ask your health care provider if you are at risk.  Ask your health care provider about whether you are at high risk for HIV. Your health care provider may recommend a prescription medicine to help prevent HIV infection. If you choose to take medicine to prevent HIV, you should first get tested for HIV. You should then be tested every 3 months for as long as you are taking the medicine. Follow these instructions at home: Lifestyle  Do not use any products that contain nicotine or tobacco, such as cigarettes, e-cigarettes, and chewing tobacco. If you need help quitting, ask your health care provider.  Do not use street drugs.  Do not share needles.  Ask your health care provider for help if you need support or information about quitting drugs. Alcohol use  Do not drink alcohol if your health care provider tells you not to drink.  If you drink alcohol: ? Limit how much you have to 0-2 drinks a day. ? Be aware of how much alcohol is in your drink. In the [place] one drink equals one 12 oz bottle of beer (355 mL), one 5 oz glass of wine (148 mL), or one 1 oz glass of hard liquor (44 mL). General instructions  Schedule regular health, dental, and eye exams.  Stay current with your vaccines.  Tell your health care provider if: ? You often feel depressed. ? You have ever been abused or do not feel safe at home. Summary  Adopting a healthy lifestyle and getting preventive care are important in promoting health and wellness.  Follow your health  care provider's instructions about healthy diet, exercising, and getting tested or screened for diseases.  Follow your health care provider's instructions on monitoring your cholesterol and blood pressure. This information is not intended to replace advice given to you by your health care provider. Make sure you discuss any questions you have with your health care provider. Document Released: 05/21/2008 Document Revised: 11/16/2018 Document Reviewed: 11/16/2018 Elsevier Patient Education  2020 [employer].

## 2019-07-13 ENCOUNTER — Encounter: Payer: Self-pay | Admitting: Family Medicine

## 2019-07-13 LAB — D-DIMER, QUANTITATIVE (NOT AT ARMC): D-Dimer, Quant: 0.43 mcg/mL FEU (ref <0.50)

## 2019-07-13 LAB — HIV ANTIBODY (ROUTINE TESTING W REFLEX): HIV 1&2 Ab, 4th Generation: NONREACTIVE

## 2019-07-30 ENCOUNTER — Encounter: Payer: Self-pay | Admitting: Family Medicine

## 2019-08-01 ENCOUNTER — Telehealth: Payer: Self-pay | Admitting: Family Medicine

## 2019-08-01 NOTE — Telephone Encounter (Signed)
Called Patient to schedule flu shot appt

## 2019-09-02 ENCOUNTER — Other Ambulatory Visit: Payer: Self-pay | Admitting: Family Medicine

## 2019-09-02 DIAGNOSIS — R0602 Shortness of breath: Secondary | ICD-10-CM

## 2019-09-29 ENCOUNTER — Other Ambulatory Visit: Payer: Self-pay | Admitting: Family Medicine

## 2019-09-29 DIAGNOSIS — R0602 Shortness of breath: Secondary | ICD-10-CM

## 2020-02-01 ENCOUNTER — Telehealth: Payer: Self-pay | Admitting: *Deleted

## 2020-02-01 NOTE — Telephone Encounter (Signed)
Called with a code to schedule. Scheduled 1st Covid 19 vaccine for 2/28@8 :15am at [facility]. Code #208

## 2020-02-04 ENCOUNTER — Ambulatory Visit: Payer: 59 | Attending: Internal Medicine

## 2020-02-04 DIAGNOSIS — Z23 Encounter for immunization: Secondary | ICD-10-CM

## 2020-02-04 NOTE — Progress Notes (Signed)
   Covid-19 Vaccination Clinic  Name:  Rodney Harris    MRN: [id number] DOB: 06-Mar-1976  02/04/2020  Rodney Harris was observed post Covid-19 immunization for 15 minutes without incidence. He was provided with Vaccine Information Sheet and instruction to access the V-Safe system.   Rodney Harris was instructed to call 911 with any severe reactions post vaccine: Marland Kitchen Difficulty breathing  . Swelling of your face and throat  . A fast heartbeat  . A bad rash all over your body  . Dizziness and weakness    Immunizations Administered    Name Date Dose VIS Date Route   Pfizer COVID-19 Vaccine 02/04/2020  8:21 AM 0.3 mL 11/17/2019 Intramuscular   Manufacturer: [place]   Lot: [id number]   [place]: [id number]

## 2020-02-05 ENCOUNTER — Other Ambulatory Visit: Payer: Self-pay

## 2020-02-06 ENCOUNTER — Other Ambulatory Visit: Payer: Self-pay

## 2020-02-06 NOTE — Progress Notes (Addendum)
[place] at [facility] [place], [place], [place] [address] [phone]  Date:  02/07/2020   Name:  Rodney Harris   DOB:  01-15-1976   MRN:  [id number]  PCP:  Darreld Mclean, MD    Chief Complaint: Groin Pain (one week off and on for a while)   History of Present Illness:  Rodney Harris is a 44 y.o. very pleasant male patient who presents with the following:  Generally healthy gentleman with history of GERD, here today with concern of groin pain Last seen by myself for physical in August 2020 He has 3 children, the youngest is just under 3 year old  He enjoys riding bikes for exercise  He has noted some perineal pain for the last 2 weeks  May feel like a muscle cramp or an ache He has noted this for years on rare occasion.   However he has noted this for about 2 weeks on a more consistent basis which made him concerned, he decided to come in  No dysuria or blood in his urine No exposure to any new sexual partners   No testicular or penile pain  Eating normally, no abdominal pain or vomiting, no fever   Patient Active Problem List   Diagnosis Date Noted  . GERD (gastroesophageal reflux disease) 07/13/2016    Past Medical History:  Diagnosis Date  . Acid reflux   . Hernia     Past Surgical History:  Procedure Laterality Date  . HERNIA REPAIR     1982  . HERNIA REPAIR     2011  . [place] TO[place] EXTRACTION      Social History   Tobacco Use  . Smoking status: Former Smoker    Quit date: 12/07/2013    Years since quitting: 6.1  . Smokeless tobacco: Never Used  Substance Use Topics  . Alcohol use: Yes    Comment: occassionally  . Drug use: No    Family History  Problem Relation Age of Onset  . Breast cancer Mother   . Hypertension Father   . Arthritis Maternal Grandmother   . Hypertension Maternal Grandmother   . Arthritis Maternal Grandfather   . Lung cancer Maternal Grandfather   .  Hypertension Maternal Grandfather   . Alcoholism Paternal Grandmother   . Arthritis Paternal Grandmother   . Breast cancer Paternal Grandmother   . Hypertension Paternal Grandmother   . Alcoholism Paternal Grandfather   . Arthritis Paternal Grandfather   . Stroke Paternal Grandfather   . Hypertension Paternal Grandfather   . Diabetes Paternal Grandfather   . Colon cancer Neg Hx   . Esophageal cancer Neg Hx   . Stomach cancer Neg Hx   . Rectal cancer Neg Hx     Allergies  Allergen Reactions  . Bee Venom Anaphylaxis  . Molds & Smuts Other (See Comments)  . Other Other (See Comments)  . Pollen Extract Other (See Comments)  . Sulfa Antibiotics Hives    Medication list has been reviewed and updated.  Current Outpatient Medications on File Prior to Visit  Medication Sig Dispense Refill  . EPINEPHrine 0.3 mg/0.3 mL IJ SOAJ injection INJECT 0.3 MLS (0.3 MG TOTAL) INTO THE MUSCLE ONCE FOR 1 DOSE. FOR ALLERGIC REACTION  99  . fexofenadine (ALLEGRA) 60 MG tablet Take 60 mg by mouth 2 (two) times daily as needed.     Marland Kitchen PROAIR HFA 108 (90 Base) MCG/ACT inhaler TAKE 2 PUFFS  BY MOUTH EVERY 6 HOURS AS NEEDED FOR WHEEZE OR SHORTNESS OF BREATH 8.5 Inhaler 0  . QVAR REDIHALER 40 MCG/ACT inhaler INHALE 1-2 PUFFS INTO THE LUNGS 2 (TWO) TIMES DAILY. 30 g 2  . sucralfate (CARAFATE) 1 g tablet Take 1 tablet (1 g total) by mouth 3 (three) times daily. 60 tablet 2   No current facility-administered medications on file prior to visit.    Review of Systems:  As per HPI- otherwise negative.   Physical Examination: Vitals:   02/07/20 1409  BP: 122/80  Pulse: 79  Resp: 16  Temp: (!) 96.6 F (35.9 C)  SpO2: 97%   Vitals:   02/07/20 1409  Weight: 199 lb (90.3 kg)  Height: 6' 1"  (1.854 m)   Body mass index is 26.25 kg/m. Ideal Body Weight: Weight in (lb) to have BMI = 25: 189.1  GEN: no acute distress. Normal weight, looks well HEENT: Atraumatic, Normocephalic.  Ears and Nose: No  external deformity. CV: RRR, No M/G/R. No JVD. No thrill. No extra heart sounds. PULM: CTA B, no wheezes, crackles, rhonchi. No retractions. No resp. distress. No accessory muscle use. ABD: S, NT, ND, +BS. No rebound. No HSM. Belly is benign Penis and testicles normal, no scrotal swelling or tenderness, no sign of inguinal hernia Perineum is not tender to palpation Prostate seems normal in size for age, it is not particularly tender but I have difficulty palpating the entire prostate EXTR: No c/c/e PSYCH: Normally interactive. Conversant.    Assessment and Plan: Acute prostatitis - Plan: Urine Culture, PSA, ciprofloxacin (CIPRO) 500 MG tablet, CBC, Sedimentation rate  Here today with perineal pain, most consistent with acute prostatitis. Labs and urine culture pending as above Will start on Cipro 500 twice daily for the time being. He will let me know how he responds to this treatment, will contact me sooner if getting worse  This visit occurred during the SARS-CoV-2 public health emergency.  Safety protocols were in place, including screening questions prior to the visit, additional usage of staff PPE, and extensive cleaning of exam room while observing appropriate contact time as indicated for disinfecting solutions.     Signed Lamar Blinks, MD  Received his labs 3/4, message to patient  Results for orders placed or performed in visit on 02/07/20  PSA  Result Value Ref Range   PSA 0.57 0.10 - 4.00 ng/mL  CBC  Result Value Ref Range   WBC 5.7 4.0 - 10.5 K/uL   RBC 4.80 4.22 - 5.81 Mil/uL   Platelets 237.0 150.0 - 400.0 K/uL   Hemoglobin 13.4 13.0 - 17.0 g/dL   HCT 38.6 (L) 39.0 - 52.0 %   MCV 80.5 78.0 - 100.0 fl   MCHC 34.6 30.0 - 36.0 g/dL   RDW 14.1 11.5 - 15.5 %  Sedimentation rate  Result Value Ref Range   Sed Rate 4 0 - 15 mm/hr

## 2020-02-07 ENCOUNTER — Other Ambulatory Visit: Payer: Self-pay

## 2020-02-07 ENCOUNTER — Ambulatory Visit (INDEPENDENT_AMBULATORY_CARE_PROVIDER_SITE_OTHER): Payer: 59 | Admitting: Family Medicine

## 2020-02-07 VITALS — BP 122/80 | HR 79 | Temp 96.6°F | Resp 16 | Ht 73.0 in | Wt 199.0 lb

## 2020-02-07 DIAGNOSIS — N410 Acute prostatitis: Secondary | ICD-10-CM | POA: Diagnosis not present

## 2020-02-07 MED ORDER — CIPROFLOXACIN HCL 500 MG PO TABS: 500.0000 mg | ORAL_TABLET | Freq: Two times a day (BID) | ORAL | 0 refills | Status: DC

## 2020-02-07 NOTE — Patient Instructions (Signed)
Good to see you today- we are going to treat you for presumed prostate infection Take cipro twice a day - we will treat for up to 4 weeks depending on your labs and how you respond.  I will be in touch with your labs asap

## 2020-02-08 ENCOUNTER — Encounter: Payer: Self-pay | Admitting: Family Medicine

## 2020-02-08 LAB — CBC
HCT: 38.6 % — ABNORMAL LOW (ref 39.0–52.0)
Hemoglobin: 13.4 g/dL (ref 13.0–17.0)
MCHC: 34.6 g/dL (ref 30.0–36.0)
MCV: 80.5 fl (ref 78.0–100.0)
Platelets: 237 10*3/uL (ref 150.0–400.0)
RBC: 4.8 Mil/uL (ref 4.22–5.81)
RDW: 14.1 % (ref 11.5–15.5)
WBC: 5.7 10*3/uL (ref 4.0–10.5)

## 2020-02-08 LAB — URINE CULTURE
MICRO NUMBER:: 10209766
Result:: NO GROWTH
SPECIMEN QUALITY:: ADEQUATE

## 2020-02-08 LAB — SEDIMENTATION RATE: Sed Rate: 4 mm/hr (ref 0–15)

## 2020-02-08 LAB — PSA: PSA: 0.57 ng/mL (ref 0.10–4.00)

## 2020-02-09 ENCOUNTER — Encounter: Payer: Self-pay | Admitting: Family Medicine

## 2020-02-24 ENCOUNTER — Ambulatory Visit: Payer: 59

## 2020-02-26 ENCOUNTER — Ambulatory Visit: Payer: 59 | Attending: Internal Medicine

## 2020-02-26 DIAGNOSIS — Z23 Encounter for immunization: Secondary | ICD-10-CM

## 2020-02-26 NOTE — Progress Notes (Signed)
   Covid-19 Vaccination Clinic  Name:  Rodney Harris    MRN: [id number] DOB: 1975/12/28  02/26/2020  Mr. Ericsson was observed post Covid-19 immunization for 30 minutes based on pre-vaccination screening without incident. He was provided with Vaccine Information Sheet and instruction to access the V-Safe system.   Mr. Hulse was instructed to call 911 with any severe reactions post vaccine: Marland Kitchen Difficulty breathing  . Swelling of face and throat  . A fast heartbeat  . A bad rash all over body  . Dizziness and weakness   Immunizations Administered    Name Date Dose VIS Date Route   Pfizer COVID-19 Vaccine 02/26/2020  8:24 AM 0.3 mL 11/17/2019 Intramuscular   Manufacturer: [place]   Lot: [id number]   [place]: [id number]

## 2020-07-08 NOTE — Patient Instructions (Addendum)
Great to see you again today! Keep up the great work with exercise  We will set you up to see allergy and asthma  cologuard kit should arrive at your home Assuming all is well please see me in about one year   Health Maintenance, Male Adopting a healthy lifestyle and getting preventive care are important in promoting health and wellness. Ask your health care provider about:  The right schedule for you to have regular tests and exams.  Things you can do on your own to prevent diseases and keep yourself healthy. What should I know about diet, weight, and exercise? Eat a healthy diet   Eat a diet that includes plenty of vegetables, fruits, low-fat dairy products, and lean protein.  Do not eat a lot of foods that are high in solid fats, added sugars, or sodium. Maintain a healthy weight Body mass index (BMI) is a measurement that can be used to identify possible weight problems. It estimates body fat based on height and weight. Your health care provider can help determine your BMI and help you achieve or maintain a healthy weight. Get regular exercise Get regular exercise. This is one of the most important things you can do for your health. Most adults should:  Exercise for at least 150 minutes each week. The exercise should increase your heart rate and make you sweat (moderate-intensity exercise).  Do strengthening exercises at least twice a week. This is in addition to the moderate-intensity exercise.  Spend less time sitting. Even light physical activity can be beneficial. Watch cholesterol and blood lipids Have your blood tested for lipids and cholesterol at 46 years of age, then have this test every 5 years. You may need to have your cholesterol levels checked more often if:  Your lipid or cholesterol levels are high.  You are older than 46 years of age.  You are at high risk for heart disease. What should I know about cancer screening? Many types of cancers can be detected  early and may often be prevented. Depending on your health history and family history, you may need to have cancer screening at various ages. This may include screening for:  Colorectal cancer.  Prostate cancer.  Skin cancer.  Lung cancer. What should I know about heart disease, diabetes, and high blood pressure? Blood pressure and heart disease  High blood pressure causes heart disease and increases the risk of stroke. This is more likely to develop in people who have high blood pressure readings, are of African descent, or are overweight.  Talk with your health care provider about your target blood pressure readings.  Have your blood pressure checked: ? Every 3-5 years if you are 39-14 years of age. ? Every year if you are 16 years old or older.  If you are between the ages of 26 and 85 and are a current or former smoker, ask your health care provider if you should have a one-time screening for abdominal aortic aneurysm (AAA). Diabetes Have regular diabetes screenings. This checks your fasting blood sugar level. Have the screening done:  Once every three years after age 51 if you are at a normal weight and have a low risk for diabetes.  More often and at a younger age if you are overweight or have a high risk for diabetes. What should I know about preventing infection? Hepatitis B If you have a higher risk for hepatitis B, you should be screened for this virus. Talk with your health care provider to  find out if you are at risk for hepatitis B infection. Hepatitis C Blood testing is recommended for:  Everyone born from [birth year] through 1967.  Anyone with known risk factors for hepatitis C. Sexually transmitted infections (STIs)  You should be screened each year for STIs, including gonorrhea and chlamydia, if: ? You are sexually active and are younger than 46 years of age. ? You are older than 46 years of age and your health care provider tells you that you are at risk for this  type of infection. ? Your sexual activity has changed since you were last screened, and you are at increased risk for chlamydia or gonorrhea. Ask your health care provider if you are at risk.  Ask your health care provider about whether you are at high risk for HIV. Your health care provider may recommend a prescription medicine to help prevent HIV infection. If you choose to take medicine to prevent HIV, you should first get tested for HIV. You should then be tested every 3 months for as long as you are taking the medicine. Follow these instructions at home: Lifestyle  Do not use any products that contain nicotine or tobacco, such as cigarettes, e-cigarettes, and chewing tobacco. If you need help quitting, ask your health care provider.  Do not use street drugs.  Do not share needles.  Ask your health care provider for help if you need support or information about quitting drugs. Alcohol use  Do not drink alcohol if your health care provider tells you not to drink.  If you drink alcohol: ? Limit how much you have to 0-2 drinks a day. ? Be aware of how much alcohol is in your drink. In the [place] one drink equals one 12 oz bottle of beer (355 mL), one 5 oz glass of wine (148 mL), or one 1 oz glass of hard liquor (44 mL). General instructions  Schedule regular health, dental, and eye exams.  Stay current with your vaccines.  Tell your health care provider if: ? You often feel depressed. ? You have ever been abused or do not feel safe at home. Summary  Adopting a healthy lifestyle and getting preventive care are important in promoting health and wellness.  Follow your health care provider's instructions about healthy diet, exercising, and getting tested or screened for diseases.  Follow your health care provider's instructions on monitoring your cholesterol and blood pressure. This information is not intended to replace advice given to you by your health care provider. Make sure  you discuss any questions you have with your health care provider. Document Revised: 11/16/2018 Document Reviewed: 11/16/2018 Elsevier Patient Education  2020 [employer].

## 2020-07-08 NOTE — Progress Notes (Addendum)
[place] at [facility] [address], [place], [place] [address] [phone]  Date:  07/15/2020   Name:  Rodney Harris   DOB:  08-14-1976   MRN:  [id number]  PCP:  Darreld Mclean, MD    Chief Complaint: Annual Exam   History of Present Illness:  Rodney Harris is a 44 y.o. very pleasant male patient who presents with the following:  Generally healthy young man with history of GERD Here today for routine physical  Last seen by myself in March with sx of prostatitis - treated with cipro Married to [place], 3 kids- 16,8 and 7 yo  Hep C screening due, routine labs are due except for CBC and PSA, he is fasting today  covid series done - phizer Colon cancer screening. No family history  No symptoms except for GERD- will set up for cologuard   He has felt well physically and mentally as well  He has some asthma symptoms- generally a couple of times a month Air quality, smoke exposure can be a trigger for him He uses just albuterol and some allegra prn  He is allergic to dogs, cats and feathers  He is interested in having a formal allergy and asthma evaluation to help pinpoint any other triggers  He enjoys cycling for exercise, he participates in offroad and also longer bicycle races No chest pain or shortness of breath with exercise Patient Active Problem List   Diagnosis Date Noted   GERD (gastroesophageal reflux disease) 07/13/2016    Past Medical History:  Diagnosis Date   Acid reflux    Hernia     Past Surgical History:  Procedure Laterality Date   HERNIA REPAIR     1982   HERNIA REPAIR     2011   WISDOM TOOTH EXTRACTION      Social History   Tobacco Use   Smoking status: Former Smoker    Quit date: 12/07/2013    Years since quitting: 6.6   Smokeless tobacco: Never Used  Vaping Use   Vaping Use: Never used  Substance Use Topics   Alcohol use: Yes    Comment: occassionally   Drug  use: No    Family History  Problem Relation Age of Onset   Breast cancer Mother    Hypertension Father    Arthritis Maternal Grandmother    Hypertension Maternal Grandmother    Arthritis Maternal Grandfather    Lung cancer Maternal Grandfather    Hypertension Maternal Grandfather    Alcoholism Paternal Grandmother    Arthritis Paternal Grandmother    Breast cancer Paternal Grandmother    Hypertension Paternal Grandmother    Alcoholism Paternal Grandfather    Arthritis Paternal Grandfather    Stroke Paternal Grandfather    Hypertension Paternal Grandfather    Diabetes Paternal Grandfather    Colon cancer Neg Hx    Esophageal cancer Neg Hx    Stomach cancer Neg Hx    Rectal cancer Neg Hx     Allergies  Allergen Reactions   Bee Venom Anaphylaxis   Molds & Smuts Other (See Comments)   Other Other (See Comments)   Pollen Extract Other (See Comments)   Sulfa Antibiotics Hives    Medication list has been reviewed and updated.  Current Outpatient Medications on File Prior to Visit  Medication Sig Dispense Refill   EPINEPHrine 0.3 mg/0.3 mL IJ SOAJ injection INJECT 0.3 MLS (0.3 MG TOTAL) INTO THE MUSCLE ONCE FOR  1 DOSE. FOR ALLERGIC REACTION  99   fexofenadine (ALLEGRA) 60 MG tablet Take 60 mg by mouth 2 (two) times daily as needed.      PROAIR HFA 108 (90 Base) MCG/ACT inhaler TAKE 2 PUFFS BY MOUTH EVERY 6 HOURS AS NEEDED FOR WHEEZE OR SHORTNESS OF BREATH 8.5 Inhaler 0   No current facility-administered medications on file prior to visit.    Review of Systems:  As per HPI- otherwise negative.   Physical Examination: Vitals:   07/15/20 0811  BP: 122/80  Pulse: 68  Resp: 16  SpO2: 98%   Vitals:   07/15/20 0811  Weight: 200 lb (90.7 kg)  Height: 6' 1"  (1.854 m)   Body mass index is 26.39 kg/m. Ideal Body Weight: Weight in (lb) to have BMI = 25: 189.1  GEN: no acute distress.  Looks well, normal weight HEENT: Atraumatic,  Normocephalic. Bilateral TM wnl, oropharynx normal.  PEERL,EOMI.   Ears and Nose: No external deformity. CV: RRR, No M/G/R. No JVD. No thrill. No extra heart sounds. PULM: CTA B, no wheezes, crackles, rhonchi. No retractions. No resp. distress. No accessory muscle use. ABD: S, NT, ND, +BS. No rebound. No HSM. EXTR: No c/c/e PSYCH: Normally interactive. Conversant.    Assessment and Plan: Physical exam  Screening for diabetes mellitus - Plan: Comprehensive metabolic panel, Hemoglobin A1c  Screening for hyperlipidemia - Plan: Lipid panel  Encounter for hepatitis C screening test for low risk patient - Plan: Hepatitis C antibody  Colon cancer screening  Mild intermittent asthma without complication - Plan: Ambulatory referral to Allergy Here today for routine physical.  Generally Rodney Harris is doing very well Encouraged continued exercise and healthy diet, continue to avoid smoking and excess alcohol Labs are pending as above Ordered Cologuard Order evaluation with asthma and allergy Will plan further follow- up pending labs.  This visit occurred during the SARS-CoV-2 public health emergency.  Safety protocols were in place, including screening questions prior to the visit, additional usage of staff PPE, and extensive cleaning of exam room while observing appropriate contact time as indicated for disinfecting solutions.    Signed Lamar Blinks, MD  Received his labs as below, message to patient  Results for orders placed or performed in visit on 07/15/20  Comprehensive metabolic panel  Result Value Ref Range   Sodium 140 135 - 145 mEq/L   Potassium 4.0 3.5 - 5.1 mEq/L   Chloride 108 96 - 112 mEq/L   CO2 25 19 - 32 mEq/L   Glucose, Bld 96 70 - 99 mg/dL   BUN 20 6 - 23 mg/dL   Creatinine, Ser 1.01 0.40 - 1.50 mg/dL   Total Bilirubin 0.5 0.2 - 1.2 mg/dL   Alkaline Phosphatase 35 (L) 39 - 117 U/L   AST 23 0 - 37 U/L   ALT 24 0 - 53 U/L   Total Protein 6.5 6.0 - 8.3 g/dL    Albumin 4.0 3.5 - 5.2 g/dL   GFR 79.52 >60.00 mL/min   Calcium 9.1 8.4 - 10.5 mg/dL  Hemoglobin A1c  Result Value Ref Range   Hgb A1c MFr Bld 5.6 4.6 - 6.5 %  Lipid panel  Result Value Ref Range   Cholesterol 191 0 - 200 mg/dL   Triglycerides 88.0 0 - 149 mg/dL   HDL 52.60 >39.00 mg/dL   VLDL 17.6 0.0 - 40.0 mg/dL   LDL Cholesterol 121 (H) 0 - 99 mg/dL   Total CHOL/HDL Ratio 4    NonHDL 138.76  The 10-year ASCVD risk score Mikey Bussing DC Brooke Bonito., et al., 2013) is: 1.8%   Values used to calculate the score:     Age: 46 years     Sex: Male     Is Non-Hispanic African American: No     Diabetic: No     Tobacco smoker: No     Systolic Blood Pressure: 681 mmHg     Is BP treated: No     HDL Cholesterol: 52.6 mg/dL     Total Cholesterol: 191 mg/dL

## 2020-07-14 ENCOUNTER — Encounter: Payer: Self-pay | Admitting: Family Medicine

## 2020-07-15 ENCOUNTER — Encounter: Payer: Self-pay | Admitting: Family Medicine

## 2020-07-15 ENCOUNTER — Ambulatory Visit (INDEPENDENT_AMBULATORY_CARE_PROVIDER_SITE_OTHER): Payer: Managed Care, Other (non HMO) | Admitting: Family Medicine

## 2020-07-15 ENCOUNTER — Other Ambulatory Visit: Payer: Self-pay

## 2020-07-15 VITALS — BP 122/80 | HR 68 | Resp 16 | Ht 73.0 in | Wt 200.0 lb

## 2020-07-15 DIAGNOSIS — Z13220 Encounter for screening for lipoid disorders: Secondary | ICD-10-CM

## 2020-07-15 DIAGNOSIS — Z1159 Encounter for screening for other viral diseases: Secondary | ICD-10-CM

## 2020-07-15 DIAGNOSIS — J4520 Mild intermittent asthma, uncomplicated: Secondary | ICD-10-CM

## 2020-07-15 DIAGNOSIS — Z1211 Encounter for screening for malignant neoplasm of colon: Secondary | ICD-10-CM

## 2020-07-15 DIAGNOSIS — Z131 Encounter for screening for diabetes mellitus: Secondary | ICD-10-CM

## 2020-07-15 DIAGNOSIS — Z0000 Encounter for general adult medical examination without abnormal findings: Secondary | ICD-10-CM

## 2020-07-15 LAB — COMPREHENSIVE METABOLIC PANEL
ALT: 24 U/L (ref 0–53)
AST: 23 U/L (ref 0–37)
Albumin: 4 g/dL (ref 3.5–5.2)
Alkaline Phosphatase: 35 U/L — ABNORMAL LOW (ref 39–117)
BUN: 20 mg/dL (ref 6–23)
CO2: 25 mEq/L (ref 19–32)
Calcium: 9.1 mg/dL (ref 8.4–10.5)
Chloride: 108 mEq/L (ref 96–112)
Creatinine, Ser: 1.01 mg/dL (ref 0.40–1.50)
GFR: 79.52 mL/min (ref >60.00)
Glucose, Bld: 96 mg/dL (ref 70–99)
Potassium: 4 mEq/L (ref 3.5–5.1)
Sodium: 140 mEq/L (ref 135–145)
Total Bilirubin: 0.5 mg/dL (ref 0.2–1.2)
Total Protein: 6.5 g/dL (ref 6.0–8.3)

## 2020-07-15 LAB — LIPID PANEL
Cholesterol: 191 mg/dL (ref 0–200)
HDL: 52.6 mg/dL (ref >39.00)
LDL Cholesterol: 121 mg/dL — ABNORMAL HIGH (ref 0–99)
NonHDL: 138.76
Total CHOL/HDL Ratio: 4
Triglycerides: 88 mg/dL (ref 0.0–149.0)
VLDL: 17.6 mg/dL (ref 0.0–40.0)

## 2020-07-15 LAB — HEMOGLOBIN A1C: Hgb A1c MFr Bld: 5.6 % (ref 4.6–6.5)

## 2020-07-15 NOTE — Progress Notes (Signed)
Cologuard ordered for patient.

## 2020-07-16 ENCOUNTER — Other Ambulatory Visit: Payer: Self-pay

## 2020-07-16 DIAGNOSIS — J40 Bronchitis, not specified as acute or chronic: Secondary | ICD-10-CM

## 2020-07-16 LAB — HEPATITIS C ANTIBODY
Hepatitis C Ab: NONREACTIVE
SIGNAL TO CUT-OFF: 0.01 (ref <1.00)

## 2020-07-16 MED ORDER — ALBUTEROL SULFATE HFA 108 (90 BASE) MCG/ACT IN AERS: 2.0000 | INHALATION_SPRAY | Freq: Four times a day (QID) | RESPIRATORY_TRACT | 3 refills | Status: DC | PRN

## 2020-07-28 LAB — COLOGUARD
COLOGUARD: NEGATIVE
Cologuard: NEGATIVE

## 2020-07-29 ENCOUNTER — Encounter: Payer: Self-pay | Admitting: Family Medicine

## 2020-10-31 ENCOUNTER — Encounter: Payer: Self-pay | Admitting: Family Medicine

## 2020-11-08 ENCOUNTER — Other Ambulatory Visit: Payer: Self-pay | Admitting: Family Medicine

## 2020-11-08 MED ORDER — DOXYCYCLINE HYCLATE 100 MG PO CAPS
100.0000 mg | ORAL_CAPSULE | Freq: Every day | ORAL | 0 refills | Status: DC
Start: 2020-11-08 — End: 2022-10-28

## 2020-11-09 MED ORDER — ATOVAQUONE-PROGUANIL HCL 250-100 MG PO TABS: 1.0000 | ORAL_TABLET | Freq: Every day | ORAL | 0 refills | Status: DC

## 2021-01-09 ENCOUNTER — Encounter: Payer: Self-pay | Admitting: Family Medicine

## 2021-01-10 NOTE — Telephone Encounter (Signed)
called & lvm to sched appt

## 2021-01-24 NOTE — Progress Notes (Signed)
[place] at [facility] [address] [place], [place] [address] [phone]  Date:  01/27/2021   Name:  Rodney Harris   DOB:  01-03-1976   MRN:  [id number]  PCP:  Darreld Mclean, MD    Chief Complaint: No chief complaint on file.   History of Present Illness:  Rodney Harris is a 45 y.o. very pleasant male patient who presents with the following:  mychart video visit today for concern of persistent dry cough after recent covid 19 illness -Patient location is home, my location is office.  Patient identity confirmed with 2 factors, he gives consent for virtual visit today.  The patient myself are present on the call today  Last seen by myself in August  Generally in good health, he is married with 3 children  He had covid 19 earlier this month -tested positive the week of 2/1 He got better after about 5 days Once he was otherwise better he continues to have a cough-  He will get cough "attacks" which may last about 30 minutes The cough is generally dry- he may bring up some mucus on occasion Albuterol does not help suppress the cough  He is able to exercise and does not feel SOB with exercise No fever, no vomiting or diarrhea Cough is worse during the day, he is sleeping fine at night.  He does not want anything as a cough suppressant  Patient Active Problem List   Diagnosis Date Noted  . GERD (gastroesophageal reflux disease) 07/13/2016    Past Medical History:  Diagnosis Date  . Acid reflux   . Hernia     Past Surgical History:  Procedure Laterality Date  . HERNIA REPAIR     1982  . HERNIA REPAIR     2011  . WISDOM TOOTH EXTRACTION      Social History   Tobacco Use  . Smoking status: Former Smoker    Quit date: 12/07/2013    Years since quitting: 7.1  . Smokeless tobacco: Never Used  Vaping Use  . Vaping Use: Never used  Substance Use Topics  . Alcohol use: Yes    Comment: occassionally  .  Drug use: No    Family History  Problem Relation Age of Onset  . Breast cancer Mother   . Hypertension Father   . Arthritis Maternal Grandmother   . Hypertension Maternal Grandmother   . Arthritis Maternal Grandfather   . Lung cancer Maternal Grandfather   . Hypertension Maternal Grandfather   . Alcoholism Paternal Grandmother   . Arthritis Paternal Grandmother   . Breast cancer Paternal Grandmother   . Hypertension Paternal Grandmother   . Alcoholism Paternal Grandfather   . Arthritis Paternal Grandfather   . Stroke Paternal Grandfather   . Hypertension Paternal Grandfather   . Diabetes Paternal Grandfather   . Colon cancer Neg Hx   . Esophageal cancer Neg Hx   . Stomach cancer Neg Hx   . Rectal cancer Neg Hx     Allergies  Allergen Reactions  . Bee Venom Anaphylaxis  . Molds & Smuts Other (See Comments)  . Other Other (See Comments)  . Pollen Extract Other (See Comments)  . Sulfa Antibiotics Hives    Medication list has been reviewed and updated.  Current Outpatient Medications on File Prior to Visit  Medication Sig Dispense Refill  . albuterol (VENTOLIN HFA) 108 (90 Base) MCG/ACT inhaler Inhale 2 puffs into the lungs every  6 (six) hours as needed for wheezing or shortness of breath. 18 g 3  . atovaquone-proguanil (MALARONE) 250-100 MG TABS tablet Take 1 tablet by mouth daily. Start 2 days before malaria exposure and continue for 7 days after exposure 40 tablet 0  . doxycycline (VIBRAMYCIN) 100 MG capsule Take 1 capsule (100 mg total) by mouth daily. For malaria prevention.  Start 1-2 days prior to start of exposure, continue for 4 weeks after exposure ends 60 capsule 0  . EPINEPHrine 0.3 mg/0.3 mL IJ SOAJ injection INJECT 0.3 MLS (0.3 MG TOTAL) INTO THE MUSCLE ONCE FOR 1 DOSE. FOR ALLERGIC REACTION  99  . fexofenadine (ALLEGRA) 60 MG tablet Take 60 mg by mouth 2 (two) times daily as needed.     Marland Kitchen PROAIR HFA 108 (90 Base) MCG/ACT inhaler TAKE 2 PUFFS BY MOUTH EVERY 6  HOURS AS NEEDED FOR WHEEZE OR SHORTNESS OF BREATH 8.5 Inhaler 0   No current facility-administered medications on file prior to visit.    Review of Systems:  As per HPI- otherwise negative.   Physical Examination: There were no vitals filed for this visit. There were no vitals filed for this visit. There is no height or weight on file to calculate BMI. Ideal Body Weight:    Patient looks well.  No distress, shortness of breath or cough is noted.  He is checking his oxygen saturations and notes he is staying over 95%  Assessment and Plan: Cough - Plan: predniSONE (DELTASONE) 20 MG tablet  Following up today from recent COVID-19.  Patient symptoms are overall better, but he continues to have a sporadic cough.  We will treat him with prednisone for 6 days as below.  Discussed prednisone use and most likely side effects.  He will let me know if not feeling better in the next several days- Sooner if worse.  Video used for entirety of visit today  Meds ordered this encounter  Medications  . predniSONE (DELTASONE) 20 MG tablet    Sig: Take 40 mg daily for 3 days, then 20 mg daily for 3 days    Dispense:  9 tablet    Refill:  0     Signed Lamar Blinks, MD

## 2021-01-27 ENCOUNTER — Other Ambulatory Visit: Payer: Self-pay

## 2021-01-27 ENCOUNTER — Telehealth (INDEPENDENT_AMBULATORY_CARE_PROVIDER_SITE_OTHER): Payer: Self-pay | Admitting: Family Medicine

## 2021-01-27 DIAGNOSIS — R059 Cough, unspecified: Secondary | ICD-10-CM

## 2021-01-27 MED ORDER — PREDNISONE 20 MG PO TABS: ORAL_TABLET | ORAL | 0 refills | Status: DC

## 2022-10-07 ENCOUNTER — Other Ambulatory Visit: Payer: Self-pay | Admitting: Family Medicine

## 2022-10-07 ENCOUNTER — Encounter: Payer: Self-pay | Admitting: Family Medicine

## 2022-10-07 DIAGNOSIS — J40 Bronchitis, not specified as acute or chronic: Secondary | ICD-10-CM

## 2022-10-07 MED ORDER — ALBUTEROL SULFATE HFA 108 (90 BASE) MCG/ACT IN AERS: 2.0000 | INHALATION_SPRAY | Freq: Four times a day (QID) | RESPIRATORY_TRACT | 0 refills | Status: AC | PRN

## 2022-10-19 ENCOUNTER — Encounter: Payer: Self-pay | Admitting: Family Medicine

## 2022-10-25 NOTE — Patient Instructions (Incomplete)
It was great to see you again today, I will be in touch with your labs soon as possible.  Recommend the latest COVID-19 vaccine if not done already  Venous insufficiency is a very common cause of swelling.  Compression socks are a good way to manage this, let me know if getting worse or otherwise more bothersome We froze a likely actinic keratosis off your left am.  However, if this does not go away over the next month of so please let me know and we can remove it surgically

## 2022-10-25 NOTE — Progress Notes (Unsigned)
[facility] at [facility] [address], [address] [place], [place] [address] [phone]  Date:  10/28/2022   Name:  Rodney Harris   DOB:  [birth year]   MRN:  [id number]  PCP:  Pearline Cables, MD    Chief Complaint: No chief complaint on file.   History of Present Illness:  Rodney Harris is a 46 y.o. very pleasant male patient who presents with the following:  Patient seen today for physical.  Generally healthy, history of hernia repair and GERD, mild asthma and allergies Most recent visit with myself was a virtual visit in February 2022, last face-to-face visit August 2021  Married to [employer] patient 3 children are 18, 10, 3  He enjoys cycling for exercise  Recommend COVID booster Flu Cologuard due next year Labs are due for update Can offer PSA Patient Active Problem List   Diagnosis Date Noted   GERD (gastroesophageal reflux disease) 07/13/2016    Past Medical History:  Diagnosis Date   Acid reflux    Hernia     Past Surgical History:  Procedure Laterality Date   HERNIA REPAIR     1982   HERNIA REPAIR     2011   WISDOM TOOTH EXTRACTION      Social History   Tobacco Use   Smoking status: Former    Types: Cigarettes    Quit date: 12/07/2013    Years since quitting: 8.8   Smokeless tobacco: Never  Vaping Use   Vaping Use: Never used  Substance Use Topics   Alcohol use: Yes    Comment: occassionally   Drug use: No    Family History  Problem Relation Age of Onset   Breast cancer Mother    Hypertension Father    Arthritis Maternal Grandmother    Hypertension Maternal Grandmother    Arthritis Maternal Grandfather    Lung cancer Maternal Grandfather    Hypertension Maternal Grandfather    Alcoholism Paternal Grandmother    Arthritis Paternal Grandmother    Breast cancer Paternal Grandmother    Hypertension Paternal Grandmother    Alcoholism Paternal Grandfather    Arthritis Paternal Grandfather     Stroke Paternal Grandfather    Hypertension Paternal Grandfather    Diabetes Paternal Grandfather    Colon cancer Neg Hx    Esophageal cancer Neg Hx    Stomach cancer Neg Hx    Rectal cancer Neg Hx     Allergies  Allergen Reactions   Bee Venom Anaphylaxis   Molds & Smuts Other (See Comments)   Other Other (See Comments)   Pollen Extract Other (See Comments)   Sulfa Antibiotics Hives    Medication list has been reviewed and updated.  Current Outpatient Medications on File Prior to Visit  Medication Sig Dispense Refill   albuterol (PROAIR HFA) 108 (90 Base) MCG/ACT inhaler Inhale 2 puffs into the lungs every 6 (six) hours as needed for wheezing or shortness of breath. 16 each 0   atovaquone-proguanil (MALARONE) 250-100 MG TABS tablet Take 1 tablet by mouth daily. Start 2 days before malaria exposure and continue for 7 days after exposure 40 tablet 0   doxycycline (VIBRAMYCIN) 100 MG capsule Take 1 capsule (100 mg total) by mouth daily. For malaria prevention.  Start 1-2 days prior to start of exposure, continue for 4 weeks after exposure ends 60 capsule 0   EPINEPHrine 0.3 mg/0.3 mL IJ SOAJ injection INJECT 0.3 MLS (0.3 MG TOTAL) INTO THE  MUSCLE ONCE FOR 1 DOSE. FOR ALLERGIC REACTION  99   fexofenadine (ALLEGRA) 60 MG tablet Take 60 mg by mouth 2 (two) times daily as needed.      predniSONE (DELTASONE) 20 MG tablet Take 40 mg daily for 3 days, then 20 mg daily for 3 days 9 tablet 0   No current facility-administered medications on file prior to visit.    Review of Systems:  As per HPI- otherwise negative.   Physical Examination: There were no vitals filed for this visit. There were no vitals filed for this visit. There is no height or weight on file to calculate BMI. Ideal Body Weight:    GEN: no acute distress. HEENT: Atraumatic, Normocephalic.  Ears and Nose: No external deformity. CV: RRR, No M/G/R. No JVD. No thrill. No extra heart sounds. PULM: CTA B, no wheezes,  crackles, rhonchi. No retractions. No resp. distress. No accessory muscle use. ABD: S, NT, ND, +BS. No rebound. No HSM. EXTR: No c/c/e PSYCH: Normally interactive. Conversant.    Assessment and Plan: *** Physical exam today.  Encouraged healthy diet and exercise routine Will plan further follow- up pending labs.  Signed Abbe Amsterdam, MD

## 2022-10-26 ENCOUNTER — Ambulatory Visit: Payer: Self-pay | Admitting: Family Medicine

## 2022-10-28 ENCOUNTER — Other Ambulatory Visit: Payer: Self-pay | Admitting: *Deleted

## 2022-10-28 ENCOUNTER — Other Ambulatory Visit (INDEPENDENT_AMBULATORY_CARE_PROVIDER_SITE_OTHER): Payer: No Typology Code available for payment source

## 2022-10-28 ENCOUNTER — Ambulatory Visit (INDEPENDENT_AMBULATORY_CARE_PROVIDER_SITE_OTHER): Payer: No Typology Code available for payment source | Admitting: Family Medicine

## 2022-10-28 ENCOUNTER — Encounter: Payer: Self-pay | Admitting: Family Medicine

## 2022-10-28 VITALS — BP 120/78 | HR 80 | Temp 97.7°F | Resp 18 | Ht 73.0 in | Wt 201.4 lb

## 2022-10-28 DIAGNOSIS — Z131 Encounter for screening for diabetes mellitus: Secondary | ICD-10-CM | POA: Diagnosis not present

## 2022-10-28 DIAGNOSIS — Z125 Encounter for screening for malignant neoplasm of prostate: Secondary | ICD-10-CM | POA: Diagnosis not present

## 2022-10-28 DIAGNOSIS — Z0000 Encounter for general adult medical examination without abnormal findings: Secondary | ICD-10-CM

## 2022-10-28 DIAGNOSIS — Z130 Encounter for screening for diseases of the blood and blood-forming organs and certain disorders involving the immune mechanism: Secondary | ICD-10-CM

## 2022-10-28 DIAGNOSIS — D649 Anemia, unspecified: Secondary | ICD-10-CM

## 2022-10-28 DIAGNOSIS — Z13220 Encounter for screening for lipoid disorders: Secondary | ICD-10-CM

## 2022-10-28 LAB — CBC
HCT: 38.9 % — ABNORMAL LOW (ref 39.0–52.0)
Hemoglobin: 12.8 g/dL — ABNORMAL LOW (ref 13.0–17.0)
MCHC: 32.8 g/dL (ref 30.0–36.0)
MCV: 74.7 fl — ABNORMAL LOW (ref 78.0–100.0)
Platelets: 265 10*3/uL (ref 150.0–400.0)
RBC: 5.21 Mil/uL (ref 4.22–5.81)
RDW: 16.1 % — ABNORMAL HIGH (ref 11.5–15.5)
WBC: 4.5 10*3/uL (ref 4.0–10.5)

## 2022-10-28 LAB — COMPREHENSIVE METABOLIC PANEL
ALT: 17 U/L (ref 0–53)
AST: 21 U/L (ref 0–37)
Albumin: 4.4 g/dL (ref 3.5–5.2)
Alkaline Phosphatase: 48 U/L (ref 39–117)
BUN: 20 mg/dL (ref 6–23)
CO2: 29 mEq/L (ref 19–32)
Calcium: 8.9 mg/dL (ref 8.4–10.5)
Chloride: 104 mEq/L (ref 96–112)
Creatinine, Ser: 0.9 mg/dL (ref 0.40–1.50)
GFR: 101.13 mL/min (ref >60.00)
Glucose, Bld: 90 mg/dL (ref 70–99)
Potassium: 4.5 mEq/L (ref 3.5–5.1)
Sodium: 138 mEq/L (ref 135–145)
Total Bilirubin: 0.5 mg/dL (ref 0.2–1.2)
Total Protein: 6.9 g/dL (ref 6.0–8.3)

## 2022-10-28 LAB — PSA: PSA: 0.57 ng/mL (ref 0.10–4.00)

## 2022-10-28 LAB — FERRITIN: Ferritin: 4.4 ng/mL — ABNORMAL LOW (ref 22.0–322.0)

## 2022-10-28 LAB — LIPID PANEL
Cholesterol: 189 mg/dL (ref 0–200)
HDL: 54 mg/dL (ref >39.00)
LDL Cholesterol: 109 mg/dL — ABNORMAL HIGH (ref 0–99)
NonHDL: 135.06
Total CHOL/HDL Ratio: 4
Triglycerides: 132 mg/dL (ref 0.0–149.0)
VLDL: 26.4 mg/dL (ref 0.0–40.0)

## 2022-10-28 LAB — HEMOGLOBIN A1C: Hgb A1c MFr Bld: 5.9 % (ref 4.6–6.5)

## 2022-11-18 ENCOUNTER — Telehealth: Payer: Self-pay | Admitting: Family Medicine

## 2022-11-18 DIAGNOSIS — E611 Iron deficiency: Secondary | ICD-10-CM

## 2022-11-18 NOTE — Telephone Encounter (Signed)
Called pt about unread mychart message He is bringing in his IFOB He would like hematology consultation for iron deficiency Place order now

## 2022-11-19 ENCOUNTER — Other Ambulatory Visit: Payer: Self-pay | Admitting: Family Medicine

## 2022-11-19 ENCOUNTER — Other Ambulatory Visit (INDEPENDENT_AMBULATORY_CARE_PROVIDER_SITE_OTHER): Payer: No Typology Code available for payment source

## 2022-11-19 ENCOUNTER — Encounter: Payer: Self-pay | Admitting: Family Medicine

## 2022-11-19 DIAGNOSIS — D649 Anemia, unspecified: Secondary | ICD-10-CM

## 2022-11-19 DIAGNOSIS — D500 Iron deficiency anemia secondary to blood loss (chronic): Secondary | ICD-10-CM

## 2022-11-19 LAB — FECAL OCCULT BLOOD, IMMUNOCHEMICAL: Fecal Occult Bld: NEGATIVE

## 2022-11-19 NOTE — Addendum Note (Signed)
Addended by: Rosita Kea on: 11/19/2022 11:19 AM   Modules accepted: Orders

## 2022-11-24 ENCOUNTER — Other Ambulatory Visit: Payer: Self-pay

## 2022-11-24 ENCOUNTER — Inpatient Hospital Stay: Payer: No Typology Code available for payment source | Attending: Hematology & Oncology

## 2022-11-24 ENCOUNTER — Inpatient Hospital Stay (HOSPITAL_BASED_OUTPATIENT_CLINIC_OR_DEPARTMENT_OTHER): Payer: No Typology Code available for payment source | Admitting: Medical Oncology

## 2022-11-24 VITALS — BP 135/87 | HR 56 | Temp 97.8°F | Resp 18

## 2022-11-24 DIAGNOSIS — Z87891 Personal history of nicotine dependence: Secondary | ICD-10-CM | POA: Diagnosis not present

## 2022-11-24 DIAGNOSIS — D509 Iron deficiency anemia, unspecified: Secondary | ICD-10-CM

## 2022-11-24 DIAGNOSIS — K219 Gastro-esophageal reflux disease without esophagitis: Secondary | ICD-10-CM | POA: Diagnosis not present

## 2022-11-24 LAB — IRON AND IRON BINDING CAPACITY (CC-WL,HP ONLY)
Iron: 79 ug/dL (ref 45–182)
Saturation Ratios: 19 % (ref 17.9–39.5)
TIBC: 409 ug/dL (ref 250–450)
UIBC: 330 ug/dL (ref 117–376)

## 2022-11-24 LAB — RETICULOCYTES
Immature Retic Fract: 12 % (ref 2.3–15.9)
RBC.: 5.22 MIL/uL (ref 4.22–5.81)
Retic Count, Absolute: 78.3 10*3/uL (ref 19.0–186.0)
Retic Ct Pct: 1.5 % (ref 0.4–3.1)

## 2022-11-24 LAB — CBC WITH DIFFERENTIAL/PLATELET
Abs Immature Granulocytes: 0.01 10*3/uL (ref 0.00–0.07)
Basophils Absolute: 0 10*3/uL (ref 0.0–0.1)
Basophils Relative: 1 %
Eosinophils Absolute: 0.1 10*3/uL (ref 0.0–0.5)
Eosinophils Relative: 3 %
HCT: 40 % (ref 39.0–52.0)
Hemoglobin: 13.2 g/dL (ref 13.0–17.0)
Immature Granulocytes: 0 %
Lymphocytes Relative: 31 %
Lymphs Abs: 1.6 10*3/uL (ref 0.7–4.0)
MCH: 25 pg — ABNORMAL LOW (ref 26.0–34.0)
MCHC: 33 g/dL (ref 30.0–36.0)
MCV: 75.6 fL — ABNORMAL LOW (ref 80.0–100.0)
Monocytes Absolute: 0.5 10*3/uL (ref 0.1–1.0)
Monocytes Relative: 10 %
Neutro Abs: 2.8 10*3/uL (ref 1.7–7.7)
Neutrophils Relative %: 55 %
Platelets: 214 10*3/uL (ref 150–400)
RBC: 5.29 MIL/uL (ref 4.22–5.81)
RDW: 16.5 % — ABNORMAL HIGH (ref 11.5–15.5)
WBC: 5 10*3/uL (ref 4.0–10.5)
nRBC: 0 % (ref 0.0–0.2)

## 2022-11-24 LAB — FERRITIN: Ferritin: 6 ng/mL — ABNORMAL LOW (ref 24–336)

## 2022-11-24 NOTE — Progress Notes (Unsigned)
[place] [phone]   Fax:([phone] [place] NOTE  Patient Care Team: Copland, Gay Filler, MD as PCP - General (Family Medicine)  Hematological/Oncological History # Started about 6 months when he noticed that he was getting cold often and had trouble with cycling which he does often. At that time his diet was switched to protein shakes without iron and vegetarian diet at night as he was trying to burn fat. His multivitamin has minimal iron.   CHIEF COMPLAINTS/PURPOSE OF CONSULTATION:  "Iron is low "  HISTORY OF PRESENTING ILLNESS:  Rodney Harris 46 y.o. male with medical history significant for GERD  Blood in stools:No- recent hemocult was negative with no stool changes or new constipation. No family history of colon cancer  Blood in urine: No Difficulty swallowing: No Pica: No SOB: Yes - 4/10 with exercise  Fatigue: Yes- 4/10 Change of bowel movement/constipation: No Prior blood transfusion: No Prior history of blood loss: No- Has given blood every 60 days for many years  Liver disease: No ETOH: None Bariatric surgery: No COVID: No recent infections  Prior evaluation with hematology: No Prior bone marrow biopsy: No Oral iron: Not currently  Prior IV iron infusions: No Family history Anemia: No- no known sickle cell or thalassemia  No Personal or family history of bleeding or clotting disorders.    MEDICAL HISTORY:  Past Medical History:  Diagnosis Date   Acid reflux    Hernia     SURGICAL HISTORY: Past Surgical History:  Procedure Laterality Date   HERNIA REPAIR     1982   HERNIA REPAIR     2011   WISDOM TOOTH EXTRACTION      SOCIAL HISTORY: Social History   Socioeconomic History   Marital status: Married    Spouse name: Not on file   Number of children: Not on file   Years of education: Not on file   Highest education level: Not on file  Occupational History   Not on file  Tobacco Use   Smoking  status: Former    Types: Cigarettes    Quit date: 12/07/2013    Years since quitting: 8.9   Smokeless tobacco: Never  Vaping Use   Vaping Use: Never used  Substance and Sexual Activity   Alcohol use: Yes    Comment: occassionally   Drug use: No   Sexual activity: Not on file  Other Topics Concern   Not on file  Social History Narrative   Not on file   Social Determinants of Health   Financial Resource Strain: Not on file  Food Insecurity: Not on file  Transportation Needs: Not on file  Physical Activity: Not on file  Stress: Not on file  Social Connections: Not on file  Intimate Partner Violence: Not on file    FAMILY HISTORY: Family History  Problem Relation Age of Onset   Breast cancer Mother    Hypertension Father    Arthritis Maternal Grandmother    Hypertension Maternal Grandmother    Arthritis Maternal Grandfather    Lung cancer Maternal Grandfather    Hypertension Maternal Grandfather    Alcoholism Paternal Grandmother    Arthritis Paternal Grandmother    Breast cancer Paternal Grandmother    Hypertension Paternal Grandmother    Alcoholism Paternal Grandfather    Arthritis Paternal Grandfather    Stroke Paternal Grandfather    Hypertension Paternal Grandfather    Diabetes Paternal Grandfather    Colon cancer Neg Hx  Esophageal cancer Neg Hx    Stomach cancer Neg Hx    Rectal cancer Neg Hx     ALLERGIES:  is allergic to bee venom, molds & smuts, other, pollen extract, and sulfa antibiotics.  MEDICATIONS:  Current Outpatient Medications  Medication Sig Dispense Refill   Multiple Vitamin (MULTIVITAMIN) tablet Take 1 tablet by mouth daily.     albuterol (PROAIR HFA) 108 (90 Base) MCG/ACT inhaler Inhale 2 puffs into the lungs every 6 (six) hours as needed for wheezing or shortness of breath. 16 each 0   EPINEPHrine 0.3 mg/0.3 mL IJ SOAJ injection INJECT 0.3 MLS (0.3 MG TOTAL) INTO THE MUSCLE ONCE FOR 1 DOSE. FOR ALLERGIC REACTION  99   No current  facility-administered medications for this visit.    REVIEW OF SYSTEMS:   Constitutional: ( - ) fevers, ( - )  chills , ( - ) night sweats Eyes: ( - ) blurriness of vision, ( - ) double vision, ( - ) watery eyes Ears, nose, mouth, throat, and face: ( - ) mucositis, ( - ) sore throat Respiratory: ( - ) cough, ( - ) dyspnea, ( - ) wheezes Cardiovascular: ( - ) palpitation, ( - ) chest discomfort, ( - ) lower extremity swelling Gastrointestinal:  ( - ) nausea, ( - ) heartburn, ( - ) change in bowel habits Skin: ( - ) abnormal skin rashes Lymphatics: ( - ) new lymphadenopathy, ( - ) easy bruising Neurological: ( - ) numbness, ( - ) tingling, ( - ) new weaknesses Behavioral/Psych: ( - ) mood change, ( - ) new changes  All other systems were reviewed with the patient and are negative.  PHYSICAL EXAMINATION: ECOG PERFORMANCE STATUS: 1 - Symptomatic but completely ambulatory  Vitals:   11/24/22 1336 11/24/22 1341  BP: (!) 141/92 135/87  Pulse: (!) 56   Resp: 18   Temp: 97.8 F (36.6 C)   SpO2: 100%     GENERAL: well appearing fit male in NAD  SKIN: skin color, texture, turgor are normal, no rashes or significant lesions EYES: conjunctiva are pink and non-injected, sclera clear OROPHARYNX: no exudate, no erythema; lips, buccal mucosa, and tongue normal  NECK: supple, non-tender LYMPH:  no palpable lymphadenopathy in the cervical, axillary or supraclavicular lymph nodes.  LUNGS: clear to auscultation and percussion with normal breathing effort HEART: regular rate & rhythm and no murmurs and no lower extremity edema ABDOMEN: soft, non-tender, non-distended, normal bowel sounds Musculoskeletal: no cyanosis of digits and no clubbing  PSYCH: alert & oriented x 3, fluent speech NEURO: no focal motor/sensory deficits  LABORATORY DATA:  I have reviewed the data as listed    Latest Ref Rng & Units 11/24/2022    1:26 PM 10/28/2022   10:12 AM 02/07/2020    2:27 PM  CBC  WBC 4.0 - 10.5  K/uL 5.0  4.5  5.7   Hemoglobin 13.0 - 17.0 g/dL 13.2  12.8  13.4   Hematocrit 39.0 - 52.0 % 40.0  38.9  38.6   Platelets 150 - 400 K/uL 214  265.0  237.0        Latest Ref Rng & Units 10/28/2022   10:12 AM 07/15/2020    8:32 AM 07/12/2019   10:31 AM  CMP  Glucose 70 - 99 mg/dL 90  96  88   BUN 6 - 23 mg/dL _0 Creatinine 0.40 - 1.50 mg/dL 0.90  1.01  0.97   Sodium 135 -  145 mEq/L 138  140  138   Potassium 3.5 - 5.1 mEq/L 4.5  4.0  4.1   Chloride 96 - 112 mEq/L 104  108  103   CO2 19 - 32 mEq/L _0 Calcium 8.4 - 10.5 mg/dL 8.9  9.1  9.4   Total Protein 6.0 - 8.3 g/dL 6.9  6.5  7.0   Total Bilirubin 0.2 - 1.2 mg/dL 0.5  0.5  0.8   Alkaline Phos 39 - 117 U/L 48  35  50   AST 0 - 37 U/L _1 ALT 0 - 53 U/L _2 RADIOGRAPHIC STUDIES: I have personally reviewed the radiological images as listed and agreed with the findings in the report. No results found.  ASSESSMENT & PLAN Encounter Diagnosis  Name Primary?   Iron deficiency anemia, unspecified iron deficiency anemia type Yes   Mr. Boody is a new patient to our office. He has clear IDA which is likely secondary to his abrupt discontinuation of iron in diet paired with continued blood donation. No known source of bleeding and his recent Hemocult testing was negative. At this time I have recommended that he start OTC once daily iron supplementation. Ideally taken in the morning with a sip or two or orange juice. I discussed common side effects including nausea and constipation. Should this occur gentle iron would be recommended. He will stay hydrated with water and discontinue donating blood at this time. He will return in 1 month with labs to assess progress. If not improving will consider IV infusion. If this does not normalize his iron levels and ferritin I would suggest an upper endoscopy given his history of GERD.   Disposition Start once daily oral iron OTC RTC 1 months with labs   Orders  Placed This Encounter  Procedures   CBC with Differential    Standing Status:   Future    Number of Occurrences:   1    Standing Expiration Date:   11/24/2023   Ferritin    Standing Status:   Future    Number of Occurrences:   1    Standing Expiration Date:   11/25/2023   Reticulocytes    Standing Status:   Future    Number of Occurrences:   1    Standing Expiration Date:   11/25/2023    All questions were answered. The patient knows to call the clinic with any problems, questions or concerns.  I have spent a total of 25 minutes minutes of face-to-face and non-face-to-face time, preparing to see the patient, obtaining and/or reviewing separately obtained history, performing a medically appropriate examination, counseling and educating the patient, ordering medications/tests/procedures, referring and communicating with other health care professionals, documenting clinical information in the electronic health record, independently interpreting results and communicating results to the patient, and care coordination.    Nelwyn Salisbury PA-C Department of Hematology/Oncology [facility]

## 2022-12-24 ENCOUNTER — Encounter: Payer: Self-pay | Admitting: Medical Oncology

## 2022-12-24 ENCOUNTER — Other Ambulatory Visit: Payer: Self-pay

## 2022-12-24 ENCOUNTER — Inpatient Hospital Stay (HOSPITAL_BASED_OUTPATIENT_CLINIC_OR_DEPARTMENT_OTHER): Payer: No Typology Code available for payment source | Admitting: Medical Oncology

## 2022-12-24 ENCOUNTER — Inpatient Hospital Stay: Payer: No Typology Code available for payment source | Attending: Medical Oncology

## 2022-12-24 VITALS — BP 136/87 | HR 80 | Temp 97.6°F | Resp 18 | Ht 73.0 in | Wt 208.0 lb

## 2022-12-24 DIAGNOSIS — Z79899 Other long term (current) drug therapy: Secondary | ICD-10-CM | POA: Diagnosis not present

## 2022-12-24 DIAGNOSIS — D509 Iron deficiency anemia, unspecified: Secondary | ICD-10-CM

## 2022-12-24 DIAGNOSIS — K219 Gastro-esophageal reflux disease without esophagitis: Secondary | ICD-10-CM | POA: Diagnosis not present

## 2022-12-24 LAB — CMP (CANCER CENTER ONLY)
ALT: 22 U/L (ref 0–44)
AST: 23 U/L (ref 15–41)
Albumin: 4.4 g/dL (ref 3.5–5.0)
Alkaline Phosphatase: 44 U/L (ref 38–126)
Anion gap: 8 (ref 5–15)
BUN: 19 mg/dL (ref 6–20)
CO2: 27 mmol/L (ref 22–32)
Calcium: 9.4 mg/dL (ref 8.9–10.3)
Chloride: 102 mmol/L (ref 98–111)
Creatinine: 1.13 mg/dL (ref 0.61–1.24)
GFR, Estimated: >60 mL/min (ref >60)
Glucose, Bld: 101 mg/dL — ABNORMAL HIGH (ref 70–99)
Potassium: 4 mmol/L (ref 3.5–5.1)
Sodium: 137 mmol/L (ref 135–145)
Total Bilirubin: 0.5 mg/dL (ref 0.3–1.2)
Total Protein: 7.2 g/dL (ref 6.5–8.1)

## 2022-12-24 LAB — CBC WITH DIFFERENTIAL/PLATELET
Abs Immature Granulocytes: 0.03 10*3/uL (ref 0.00–0.07)
Basophils Absolute: 0.1 10*3/uL (ref 0.0–0.1)
Basophils Relative: 1 %
Eosinophils Absolute: 0.1 10*3/uL (ref 0.0–0.5)
Eosinophils Relative: 2 %
HCT: 42.7 % (ref 39.0–52.0)
Hemoglobin: 14.5 g/dL (ref 13.0–17.0)
Immature Granulocytes: 1 %
Lymphocytes Relative: 30 %
Lymphs Abs: 1.7 10*3/uL (ref 0.7–4.0)
MCH: 26.1 pg (ref 26.0–34.0)
MCHC: 34 g/dL (ref 30.0–36.0)
MCV: 76.9 fL — ABNORMAL LOW (ref 80.0–100.0)
Monocytes Absolute: 0.5 10*3/uL (ref 0.1–1.0)
Monocytes Relative: 9 %
Neutro Abs: 3.3 10*3/uL (ref 1.7–7.7)
Neutrophils Relative %: 57 %
Platelets: 203 10*3/uL (ref 150–400)
RBC: 5.55 MIL/uL (ref 4.22–5.81)
RDW: 16.6 % — ABNORMAL HIGH (ref 11.5–15.5)
WBC: 5.8 10*3/uL (ref 4.0–10.5)
nRBC: 0 % (ref 0.0–0.2)

## 2022-12-24 LAB — IRON AND IRON BINDING CAPACITY (CC-WL,HP ONLY)
Iron: 203 ug/dL — ABNORMAL HIGH (ref 45–182)
Saturation Ratios: 53 % — ABNORMAL HIGH (ref 17.9–39.5)
TIBC: 385 ug/dL (ref 250–450)
UIBC: 182 ug/dL (ref 117–376)

## 2022-12-24 LAB — FERRITIN: Ferritin: 12 ng/mL — ABNORMAL LOW (ref 24–336)

## 2022-12-24 NOTE — Progress Notes (Signed)
Hematology and Oncology Follow Up Visit  Rodney Harris [id number] 11-05-1976 47 y.o. 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 His energy has returned to normal and he no longer is SOB with exercise. He has been following an iron rich diet of red meats and leafy greens. He is also taking an iron supplement which he is tolerating well.     No bleeding or bruising episodes.  Wt Readings from Last 3 Encounters:  12/24/22 208 lb (94.3 kg)  10/28/22 201 lb 6.4 oz (91.4 kg)  07/15/20 200 lb (90.7 kg)     Medications:   Current Outpatient Medications:    albuterol (PROAIR HFA) 108 (90 Base) MCG/ACT inhaler, Inhale 2 puffs into the lungs every 6 (six) hours as needed for wheezing or shortness of breath., Disp: 16 each, Rfl: 0   Multiple Vitamin (MULTIVITAMIN) tablet, Take 1 tablet by mouth daily., Disp: , Rfl:    EPINEPHrine 0.3 mg/0.3 mL IJ SOAJ injection, INJECT 0.3 MLS (0.3 MG TOTAL) INTO THE MUSCLE ONCE FOR 1 DOSE. FOR ALLERGIC REACTION (Patient not taking: Reported on 12/24/2022), Disp: , Rfl: 99  Allergies:  Allergies  Allergen Reactions   Bee Venom Anaphylaxis   Sulfa Antibiotics Hives   Molds & Smuts Other (See Comments)   Other Other (See Comments)   Pollen Extract Other (See Comments)    Past Medical History, Surgical history, Social history, and Family History were reviewed and updated.  Review of Systems: Review of Systems  Constitutional:  Negative for appetite change, fatigue and unexpected weight change.  Respiratory:  Negative for shortness of breath.   Cardiovascular:  Negative for leg swelling.   Gastrointestinal:  Negative for blood in stool.  Endocrine: Negative for hot flashes.  Genitourinary:  Negative for hematuria.      Physical Exam:  height is 6\' 1"  (1.854 m) and weight is 208 lb (94.3 kg). His oral temperature is 97.6 F (36.4 C). His blood pressure is 136/87 and his pulse is 80. His respiration is 18 and oxygen saturation is 100%.   Physical Exam Vitals and nursing note reviewed.  Constitutional:      General: He is not in acute distress.    Appearance: Normal appearance. He is not ill-appearing, toxic-appearing or diaphoretic.  HENT:     Head: Normocephalic.     Mouth/Throat:     Comments: Gums without pallor Eyes:     Conjunctiva/sclera: Conjunctivae normal.  Cardiovascular:     Rate and Rhythm: Normal rate and regular rhythm.     Pulses: Normal pulses.     Heart sounds: Normal heart sounds.  Pulmonary:     Effort: Pulmonary effort is normal.     Breath sounds: Normal breath sounds.  Skin:    General: Skin is warm.     Coloration: Skin is not jaundiced or pale.     Findings: No rash.  Neurological:     Mental Status: He is alert and oriented to person, place, and time.  Psychiatric:        Mood and Affect: Mood normal.  Behavior: Behavior normal.        Thought Content: Thought content normal.        Judgment: Judgment normal.       Lab Results  Component Value Date   WBC 5.8 12/24/2022   HGB 14.5 12/24/2022   HCT 42.7 12/24/2022   MCV 76.9 (L) 12/24/2022   PLT 203 12/24/2022     Chemistry      Component Value Date/Time   NA 137 12/24/2022 1250   K 4.0 12/24/2022 1250   CL 102 12/24/2022 1250   CO2 27 12/24/2022 1250   BUN 19 12/24/2022 1250   CREATININE 1.13 12/24/2022 1250      Component Value Date/Time   CALCIUM 9.4 12/24/2022 1250   ALKPHOS 44 12/24/2022 1250   AST 23 12/24/2022 1250   ALT 22 12/24/2022 1250   BILITOT 0.5 12/24/2022 1250      Impression and Plan: 1. Iron deficiency anemia, unspecified iron  deficiency anemia type - CBC with Differential; Future - CMP ([place] only); Future - Iron and TIBC; Future - Ferritin; Future - Reticulocytes; Future   Appears secondary to excessive blood donation and low iron diet. Tolerating his iron rich diet and iron supplement well. Still not donating blood. Improving with a hgb of 14.5 from 12.8. MCV is trending towards normal. Normal platelets. CMP -non-fasting- is normal. Iron studied pending.   Rodney Harris   Disposition: Continue iron rich diet and iron supplement x 3 months then can drop the iron supplement; should symptoms return please restart supplement and alert [place] Continue holding off on donating blood until our follow up in 6 months  RTC 6 months APP, labs    Nelwyn Salisbury PA-C 1/18/20243:05 PM

## 2022-12-30 ENCOUNTER — Encounter: Payer: Self-pay | Admitting: Family Medicine

## 2023-02-12 ENCOUNTER — Telehealth: Payer: No Typology Code available for payment source | Admitting: Nurse Practitioner

## 2023-02-12 DIAGNOSIS — J40 Bronchitis, not specified as acute or chronic: Secondary | ICD-10-CM

## 2023-02-12 MED ORDER — AZITHROMYCIN 250 MG PO TABS: ORAL_TABLET | ORAL | 0 refills | Status: AC

## 2023-02-12 MED ORDER — BENZONATATE 100 MG PO CAPS: 100.0000 mg | ORAL_CAPSULE | Freq: Three times a day (TID) | ORAL | 0 refills | Status: DC | PRN

## 2023-02-12 MED ORDER — PREDNISONE 20 MG PO TABS: 20.0000 mg | ORAL_TABLET | Freq: Two times a day (BID) | ORAL | 0 refills | Status: AC

## 2023-02-12 NOTE — Progress Notes (Signed)
Virtual Visit Consent   Rodney Harris, you are scheduled for a virtual visit with a [facility] provider today. Just as with appointments in the office, your consent must be obtained to participate. Your consent will be active for this visit and any virtual visit you may have with one of our providers in the next 365 days. If you have a MyChart account, a copy of this consent can be sent to you electronically.  As this is a virtual visit, video technology does not allow for your provider to perform a traditional examination. This may limit your provider's ability to fully assess your condition. If your provider identifies any concerns that need to be evaluated in person or the need to arrange testing (such as labs, EKG, etc.), we will make arrangements to do so. Although advances in technology are sophisticated, we cannot ensure that it will always work on either your end or our end. If the connection with a video visit is poor, the visit may have to be switched to a telephone visit. With either a video or telephone visit, we are not always able to ensure that we have a secure connection.  By engaging in this virtual visit, you consent to the provision of healthcare and authorize for your insurance to be billed (if applicable) for the services provided during this visit. Depending on your insurance coverage, you may receive a charge related to this service.  I need to obtain your verbal consent now. Are you willing to proceed with your visit today? Rodney Harris has provided verbal consent on 02/12/2023 for a virtual visit (video or telephone). Rodney Schneiders, Rodney Harris  Date: 02/12/2023 10:29 AM  Virtual Visit via Video Note   I, Rodney Harris, connected with  Rodney Harris  ([id number], 1976-07-03) on 02/12/23 at 10:30 AM EST by a video-enabled telemedicine application and verified that I am speaking with the correct person using two identifiers.  Location: Patient: Virtual Visit Location  Patient: Home Provider: Virtual Visit Location Provider: Home Office   I discussed the limitations of evaluation and management by telemedicine and the availability of in person appointments. The patient expressed understanding and agreed to proceed.    History of Present Illness: Rodney Harris is a 47 y.o. who identifies as a male who was assigned male at birth, and is being seen today for persistent cough.  He started coughing around 01/15/23 After a business trip to [place] for [employer] with a fever for 2.5 days  Cough has persisted since that time   No fever  His cough is mostly dry it can be productive at times  Feels a "rattle" with deep breaths  He does have asthma and has been using Albuterol with temporary relief  Typically only needs Albuterol during Spring with pollen  He is currently taking Allegra as needed for allergies    Problems:  Patient Active Problem List   Diagnosis Date Noted   GERD (gastroesophageal reflux disease) 07/13/2016    Allergies:  Allergies  Allergen Reactions   Bee Venom Anaphylaxis   Sulfa Antibiotics Hives   Molds & Smuts Other (See Comments)   Other Other (See Comments)   Pollen Extract Other (See Comments)   Medications:  Current Outpatient Medications:    albuterol (PROAIR HFA) 108 (90 Base) MCG/ACT inhaler, Inhale 2 puffs into the lungs every 6 (six) hours as needed for wheezing or shortness of breath., Disp: 16 each, Rfl: 0   EPINEPHrine 0.3 mg/0.3 mL IJ SOAJ injection, INJECT  0.3 MLS (0.3 MG TOTAL) INTO THE MUSCLE ONCE FOR 1 DOSE. FOR ALLERGIC REACTION (Patient not taking: Reported on 12/24/2022), Disp: , Rfl: 99   Multiple Vitamin (MULTIVITAMIN) tablet, Take 1 tablet by mouth daily., Disp: , Rfl:   Observations/Objective: Patient is well-developed, well-nourished in no acute distress.  Resting comfortably  at home.  Head is normocephalic, atraumatic.  No labored breathing.  Speech is clear and coherent with  logical content.  Patient is alert and oriented at baseline.    Assessment and Plan: 1. Bronchitis Will plan to start with prednisone, benzonatate and continue inhaler use, if marked improvement int he next 24-48 hours patient will hold antibiotics.  If improvement is slow, with any fever onset or more productive component to cough will start antibiotic as discussed  - predniSONE (DELTASONE) 20 MG tablet; Take 1 tablet (20 mg total) by mouth 2 (two) times daily with a meal for 5 days.  Dispense: 10 tablet; Refill: 0 - benzonatate (TESSALON) 100 MG capsule; Take 1 capsule (100 mg total) by mouth 3 (three) times daily as needed.  Dispense: 30 capsule; Refill: 0 - azithromycin (ZITHROMAX) 250 MG tablet; Take 2 tablets on day 1, then 1 tablet daily on days 2 through 5  Dispense: 6 tablet; Refill: 0     Follow Up Instructions: I discussed the assessment and treatment plan with the patient. The patient was provided an opportunity to ask questions and all were answered. The patient agreed with the plan and demonstrated an understanding of the instructions.  A copy of instructions were sent to the patient via MyChart unless otherwise noted below.    The patient was advised to call back or seek an in-person evaluation if the symptoms worsen or if the condition fails to improve as anticipated.  Time:  I spent 15 minutes with the patient via telehealth technology discussing the above problems/concerns.    Rodney Schneiders, Rodney Harris

## 2023-03-02 ENCOUNTER — Ambulatory Visit (HOSPITAL_BASED_OUTPATIENT_CLINIC_OR_DEPARTMENT_OTHER)
Admission: RE | Admit: 2023-03-02 | Discharge: 2023-03-02 | Disposition: A | Discharge status: Home / Self Care | Payer: No Typology Code available for payment source | Source: Ambulatory Visit | Attending: Family | Admitting: Family

## 2023-03-02 ENCOUNTER — Ambulatory Visit (INDEPENDENT_AMBULATORY_CARE_PROVIDER_SITE_OTHER): Payer: No Typology Code available for payment source | Admitting: Family

## 2023-03-02 VITALS — BP 133/75 | HR 70 | Temp 97.7°F | Resp 16 | Wt 215.0 lb

## 2023-03-02 DIAGNOSIS — R059 Cough, unspecified: Secondary | ICD-10-CM | POA: Insufficient documentation

## 2023-03-02 DIAGNOSIS — J209 Acute bronchitis, unspecified: Secondary | ICD-10-CM | POA: Diagnosis not present

## 2023-03-02 MED ORDER — PREDNISONE 10 MG PO TABS: ORAL_TABLET | ORAL | 0 refills | Status: DC

## 2023-03-02 NOTE — Progress Notes (Addendum)
Subjective:   By signing my name below, I, [name], attest that this documentation has been prepared under the direction and in the presence of Debbrah Alar, NP.  03/02/2023.   Patient ID: Rodney Harris, male    DOB: Apr 26, 1976, 47 y.o.   MRN: [id number]  Chief Complaint  Patient presents with   Cough    Patient complains of persistent cough since getting sick in February.     HPI Patient is in today for an office visit.  Persistent cough:  He was seen by Apolonio Schneiders, FNP by telehealth visit 02/12/23 and started on prednisone, benzonatate, azithromycin, and continued albuterol use. He had reported that onset of his cough was around 01/15/23 after a business trip to [facility]. Initially was febrile for 1.5 days (about 101 degrees) and tested negative for COVID and influenza. Since then his cough has been persistent but improved at times. He notes that prednisone was helpful temporarily (course ended 16 days ago). This past weekend he had a severe coughing fit that nearly caused emesis. Currently he also complains of wheezing. He denies any recent fever, sore throat, itchy eyes, or rhinorrhea.  Exercise:  While exercising, he sometimes needs to use albuterol if his pollen allergies are severe. He rides his bike multiple times a week.   Past Medical History:  Diagnosis Date   Acid reflux    Hernia     Past Surgical History:  Procedure Laterality Date   HERNIA REPAIR     1982   HERNIA REPAIR     2011   WISDOM TOOTH EXTRACTION      Family History  Problem Relation Age of Onset   Breast cancer Mother    Hypertension Father    Arthritis Maternal Grandmother    Hypertension Maternal Grandmother    Arthritis Maternal Grandfather    Lung cancer Maternal Grandfather    Hypertension Maternal Grandfather    Alcoholism Paternal Grandmother    Arthritis Paternal Grandmother    Breast cancer Paternal Grandmother    Hypertension Paternal Grandmother    Alcoholism  Paternal Grandfather    Arthritis Paternal Grandfather    Stroke Paternal Grandfather    Hypertension Paternal Grandfather    Diabetes Paternal Grandfather    Colon cancer Neg Hx    Esophageal cancer Neg Hx    Stomach cancer Neg Hx    Rectal cancer Neg Hx     Social History   Socioeconomic History   Marital status: Married    Spouse name: Not on file   Number of children: Not on file   Years of education: Not on file   Highest education level: Bachelor's degree (e.g., BA, AB, BS)  Occupational History   Not on file  Tobacco Use   Smoking status: Former    Types: Cigarettes    Quit date: 12/07/2013    Years since quitting: 9.2   Smokeless tobacco: Never  Vaping Use   Vaping Use: Never used  Substance and Sexual Activity   Alcohol use: Yes    Comment: occassionally   Drug use: No   Sexual activity: Not on file  Other Topics Concern   Not on file  Social History Narrative   Not on file   Social Determinants of Health   Financial Resource Strain: Low Risk  (03/01/2023)   Overall Financial Resource Strain (CARDIA)    Difficulty of Paying Living Expenses: Not very hard  Food Insecurity: No Food Insecurity (03/01/2023)   Hunger Vital Sign  Worried About Charity fundraiser in the Last Year: Never true    [place] in the Last Year: Never true  Transportation Needs: No Transportation Needs (03/01/2023)   PRAPARE - Hydrologist (Medical): No    Lack of Transportation (Non-Medical): No  Physical Activity: Sufficiently Active (03/01/2023)   Exercise Vital Sign    Days of Exercise per Week: 3 days    Minutes of Exercise per Session: 120 min  Stress: No Stress Concern Present (03/01/2023)   [place]    Feeling of Stress : Only a little  Social Connections: Socially Integrated (03/01/2023)   Social Connection and Isolation Panel [NHANES]    Frequency of Communication with  Friends and Family: More than three times a week    Frequency of Social Gatherings with Friends and Family: More than three times a week    Attends Religious Services: More than 4 times per year    Active Member of [employer] or Organizations: Yes    Attends Music therapist: More than 4 times per year    Marital Status: Married  Human resources officer Violence: Not on file    Outpatient Medications Prior to Visit  Medication Sig Dispense Refill   albuterol (PROAIR HFA) 108 (90 Base) MCG/ACT inhaler Inhale 2 puffs into the lungs every 6 (six) hours as needed for wheezing or shortness of breath. 16 each 0   EPINEPHrine 0.3 mg/0.3 mL IJ SOAJ injection   99   Multiple Vitamin (MULTIVITAMIN) tablet Take 1 tablet by mouth daily.     benzonatate (TESSALON) 100 MG capsule Take 1 capsule (100 mg total) by mouth 3 (three) times daily as needed. 30 capsule 0   No facility-administered medications prior to visit.    Allergies  Allergen Reactions   Bee Venom Anaphylaxis   Sulfa Antibiotics Hives   Molds & Smuts Other (See Comments)   Other Other (See Comments)   Pollen Extract Other (See Comments)    Review of Systems  Respiratory:  Positive for cough and wheezing.   See HPI.     Objective:    Physical Exam Constitutional:      General: He is not in acute distress.    Appearance: Normal appearance. He is not ill-appearing.  HENT:     Head: Normocephalic and atraumatic.     Right Ear: Tympanic membrane, ear canal and external ear normal.     Left Ear: Tympanic membrane, ear canal and external ear normal.  Eyes:     Extraocular Movements: Extraocular movements intact.     Pupils: Pupils are equal, round, and reactive to light.  Cardiovascular:     Rate and Rhythm: Normal rate and regular rhythm.     Heart sounds: Normal heart sounds. No murmur heard.    No gallop.  Pulmonary:     Effort: Pulmonary effort is normal. No respiratory distress.     Breath sounds: Wheezing present.  No rales.     Comments: Inspiratory and expiratory wheezing of left lung. Right lung is clear to auscultation. Skin:    General: Skin is warm and dry.  Neurological:     General: No focal deficit present.     Mental Status: He is alert and oriented to person, place, and time.  Psychiatric:        Mood and Affect: Mood normal.        Behavior: Behavior normal.  BP 133/75 (BP Location: Right Arm, Patient Position: Sitting, Cuff Size: Small)   Pulse 70   Temp 97.7 F (36.5 C) (Oral)   Resp 16   Wt 215 lb (97.5 kg)   SpO2 98%   BMI 28.37 kg/m  Wt Readings from Last 3 Encounters:  03/02/23 215 lb (97.5 kg)  12/24/22 208 lb (94.3 kg)  10/28/22 201 lb 6.4 oz (91.4 kg)        Assessment & Plan:   Problem List Items Addressed This Visit       Unprioritized   Bronchitis with bronchospasm    Uncontrolled. Will repeat prednisone with a longer taper. Recommended standing albuterol 2 puffs every 6 hours until symptoms improved.  Will obtain CXR to rule out pneumonia.       Other Visit Diagnoses     Cough, unspecified type    -  Primary   Relevant Orders   DG Chest 2 View        Meds ordered this encounter  Medications   predniSONE (DELTASONE) 10 MG tablet    Sig: 4 tabs by mouth once daily for 2 days, then 3 tabs daily x 2 days, then 2 tabs daily x 2 days, then 1 tab daily x 2 days    Dispense:  20 tablet    Refill:  0    Order Specific Question:   Supervising Provider    Answer:   Mosie Lukes [[id number]]    I, Nance Pear, NP, personally preformed the services described in this documentation.  All medical record entries made by the scribe were at my direction and in my presence.  I have reviewed the chart and discharge instructions (if applicable) and agree that the record reflects my personal performance and is accurate and complete. 03/02/2023.  I,Mathew Stumpf,acting as a Education administrator for [employer], NP.,have documented all relevant documentation on  the behalf of Nance Pear, NP,as directed by  Nance Pear, NP while in the presence of Nance Pear, NP.   Nance Pear, NP

## 2023-03-02 NOTE — Assessment & Plan Note (Addendum)
Uncontrolled. Will repeat prednisone with a longer taper. Recommended standing albuterol 2 puffs every 6 hours until symptoms improved.  Will obtain CXR to rule out pneumonia.

## 2023-06-11 ENCOUNTER — Inpatient Hospital Stay: Payer: No Typology Code available for payment source | Attending: Medical Oncology

## 2023-06-11 DIAGNOSIS — D509 Iron deficiency anemia, unspecified: Secondary | ICD-10-CM | POA: Insufficient documentation

## 2023-06-11 DIAGNOSIS — Z79899 Other long term (current) drug therapy: Secondary | ICD-10-CM | POA: Insufficient documentation

## 2023-06-11 LAB — CBC WITH DIFFERENTIAL/PLATELET
Abs Immature Granulocytes: 0.04 10*3/uL (ref 0.00–0.07)
Basophils Absolute: 0.1 10*3/uL (ref 0.0–0.1)
Basophils Relative: 1 %
Eosinophils Absolute: 0.2 10*3/uL (ref 0.0–0.5)
Eosinophils Relative: 3 %
HCT: 45.2 % (ref 39.0–52.0)
Hemoglobin: 15.8 g/dL (ref 13.0–17.0)
Immature Granulocytes: 1 %
Lymphocytes Relative: 27 %
Lymphs Abs: 1.5 10*3/uL (ref 0.7–4.0)
MCH: 29.2 pg (ref 26.0–34.0)
MCHC: 35 g/dL (ref 30.0–36.0)
MCV: 83.4 fL (ref 80.0–100.0)
Monocytes Absolute: 0.5 10*3/uL (ref 0.1–1.0)
Monocytes Relative: 10 %
Neutro Abs: 3.1 10*3/uL (ref 1.7–7.7)
Neutrophils Relative %: 58 %
Platelets: 196 10*3/uL (ref 150–400)
RBC: 5.42 MIL/uL (ref 4.22–5.81)
RDW: 12.6 % (ref 11.5–15.5)
WBC: 5.4 10*3/uL (ref 4.0–10.5)
nRBC: 0 % (ref 0.0–0.2)

## 2023-06-11 LAB — CMP (CANCER CENTER ONLY)
ALT: 33 U/L (ref 0–44)
AST: 25 U/L (ref 15–41)
Albumin: 4.5 g/dL (ref 3.5–5.0)
Alkaline Phosphatase: 43 U/L (ref 38–126)
Anion gap: 6 (ref 5–15)
BUN: 14 mg/dL (ref 6–20)
CO2: 29 mmol/L (ref 22–32)
Calcium: 9.5 mg/dL (ref 8.9–10.3)
Chloride: 106 mmol/L (ref 98–111)
Creatinine: 1.06 mg/dL (ref 0.61–1.24)
GFR, Estimated: >60 mL/min (ref >60)
Glucose, Bld: 91 mg/dL (ref 70–99)
Potassium: 4.2 mmol/L (ref 3.5–5.1)
Sodium: 141 mmol/L (ref 135–145)
Total Bilirubin: 0.6 mg/dL (ref 0.3–1.2)
Total Protein: 7.1 g/dL (ref 6.5–8.1)

## 2023-06-11 LAB — RETICULOCYTES
Immature Retic Fract: 9.2 % (ref 2.3–15.9)
RBC.: 5.46 MIL/uL (ref 4.22–5.81)
Retic Count, Absolute: 98.8 10*3/uL (ref 19.0–186.0)
Retic Ct Pct: 1.8 % (ref 0.4–3.1)

## 2023-06-11 LAB — FERRITIN: Ferritin: 27 ng/mL (ref 24–336)

## 2023-06-11 LAB — IRON AND IRON BINDING CAPACITY (CC-WL,HP ONLY)
Iron: 123 ug/dL (ref 45–182)
Saturation Ratios: 36 % (ref 17.9–39.5)
TIBC: 340 ug/dL (ref 250–450)
UIBC: 217 ug/dL (ref 117–376)

## 2023-06-24 ENCOUNTER — Inpatient Hospital Stay (HOSPITAL_BASED_OUTPATIENT_CLINIC_OR_DEPARTMENT_OTHER): Payer: No Typology Code available for payment source | Admitting: Medical Oncology

## 2023-06-24 ENCOUNTER — Encounter: Payer: Self-pay | Admitting: Medical Oncology

## 2023-06-24 ENCOUNTER — Other Ambulatory Visit: Payer: Self-pay

## 2023-06-24 VITALS — BP 130/76 | HR 57 | Temp 97.8°F | Resp 18 | Ht 73.0 in | Wt 213.1 lb

## 2023-06-24 DIAGNOSIS — D509 Iron deficiency anemia, unspecified: Secondary | ICD-10-CM

## 2023-06-24 NOTE — Progress Notes (Signed)
Hematology and Oncology Follow Up Visit  Rodney Harris [id number] Nov 01, 1976 47 y.o. 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 He is following an iron rich diet and not donating blood. He has stopped his iron supplement to see if he would be able to maintain his levels just on diet alone. He denies any bleeding episodes, SOB, fatigue, dizziness. He also denies any unintentional weight loss or night sweats.     No bleeding or bruising episodes.  Wt Readings from Last 3 Encounters:  06/24/23 213 lb 1.9 oz (96.7 kg)  03/02/23 215 lb (97.5 kg)  12/24/22 208 lb (94.3 kg)     Medications:   Current Outpatient Medications:    albuterol (PROAIR HFA) 108 (90 Base) MCG/ACT inhaler, Inhale 2 puffs into the lungs every 6 (six) hours as needed for wheezing or shortness of breath., Disp: 16 each, Rfl: 0   esomeprazole (NEXIUM) 40 MG capsule, Take 40 mg by mouth daily., Disp: , Rfl:    Multiple Vitamin (MULTIVITAMIN) tablet, Take 1 tablet by mouth daily., Disp: , Rfl:    EPINEPHrine 0.3 mg/0.3 mL IJ SOAJ injection, , Disp: , Rfl: 99  Allergies:  Allergies  Allergen Reactions   Bee Venom Anaphylaxis   Molds & Smuts Other (See Comments)    A bad sinus infection   Pollen Extract Other (See Comments)    A bad sinus infection   Sulfa Antibiotics Hives    Past Medical History, Surgical history, Social history, and Family History were reviewed and updated.  Review of Systems: Review of Systems  Constitutional:  Negative for appetite change, fatigue and unexpected weight change.  Respiratory:  Negative for shortness of breath.   Cardiovascular:  Negative for leg swelling.  Gastrointestinal:   Negative for blood in stool.  Endocrine: Negative for hot flashes.  Genitourinary:  Negative for hematuria.      Physical Exam:  height is 6\' 1"  (1.854 m) and weight is 213 lb 1.9 oz (96.7 kg). His oral temperature is 97.8 F (36.6 C). His blood pressure is 130/76 and his pulse is 57 (abnormal). His respiration is 18 and oxygen saturation is 100%.   Physical Exam Vitals and nursing note reviewed.  Constitutional:      General: He is not in acute distress.    Appearance: Normal appearance. He is not ill-appearing, toxic-appearing or diaphoretic.  HENT:     Head: Normocephalic.     Mouth/Throat:     Comments: Gums without pallor Eyes:     Conjunctiva/sclera: Conjunctivae normal.  Cardiovascular:     Rate and Rhythm: Normal rate and regular rhythm.     Pulses: Normal pulses.     Heart sounds: Normal heart sounds.  Pulmonary:     Effort: Pulmonary effort is normal.     Breath sounds: Normal breath sounds.  Skin:    General: Skin is warm.     Coloration: Skin is not jaundiced or pale.     Findings: No rash.  Neurological:     Mental Status: He is alert and oriented to person, place, and time.  Psychiatric:  Mood and Affect: Mood normal.        Behavior: Behavior normal.        Thought Content: Thought content normal.        Judgment: Judgment normal.       Lab Results  Component Value Date   WBC 5.4 06/11/2023   HGB 15.8 06/11/2023   HCT 45.2 06/11/2023   MCV 83.4 06/11/2023   PLT 196 06/11/2023     Chemistry      Component Value Date/Time   NA 141 06/11/2023 0950   K 4.2 06/11/2023 0950   CL 106 06/11/2023 0950   CO2 29 06/11/2023 0950   BUN 14 06/11/2023 0950   CREATININE 1.06 06/11/2023 0950      Component Value Date/Time   CALCIUM 9.5 06/11/2023 0950   ALKPHOS 43 06/11/2023 0950   AST 25 06/11/2023 0950   ALT 33 06/11/2023 0950   BILITOT 0.6 06/11/2023 0950      Impression and Plan: 1. Iron deficiency anemia, unspecified iron deficiency  anemia type    His IDA still appears secondary to excessive blood donation and low iron diet. Recent labs show continued improvement of iron studies. Ferritin has increased from 4.4 to 27, iron saturation is now 36%. Normal TIBC. Normal Hgb of 15.8 and normal MCV. Normal Platelets and WBCs.   He will continue his iron rich diet. He will take an iron supplement 2-4 times weekly. He will follow up in 6 months with labs.   Disposition: RTC 6 months labs only ( CBC w/, Iron, ferritin) RTC 6 months + 3-7 days APP    Clent Jacks PA-C 7/18/202410:46 AM

## 2023-07-18 ENCOUNTER — Emergency Department (HOSPITAL_BASED_OUTPATIENT_CLINIC_OR_DEPARTMENT_OTHER)
Admission: EM | Admit: 2023-07-18 | Discharge: 2023-07-18 | Disposition: A | Discharge status: Home / Self Care | Payer: No Typology Code available for payment source | Attending: Emergency Medicine | Admitting: Emergency Medicine

## 2023-07-18 ENCOUNTER — Encounter (HOSPITAL_BASED_OUTPATIENT_CLINIC_OR_DEPARTMENT_OTHER): Payer: Self-pay | Admitting: Emergency Medicine

## 2023-07-18 DIAGNOSIS — T63441A Toxic effect of venom of bees, accidental (unintentional), initial encounter: Secondary | ICD-10-CM | POA: Diagnosis not present

## 2023-07-18 DIAGNOSIS — R21 Rash and other nonspecific skin eruption: Secondary | ICD-10-CM | POA: Diagnosis not present

## 2023-07-18 MED ORDER — PREDNISONE 50 MG PO TABS
60.0000 mg | ORAL_TABLET | Freq: Once | ORAL | Status: AC
  Administered 2023-07-18: 60 mg via ORAL
  Filled 2023-07-18: qty 1

## 2023-07-18 MED ORDER — DIPHENHYDRAMINE HCL 25 MG PO CAPS
50.0000 mg | ORAL_CAPSULE | Freq: Once | ORAL | Status: AC
  Administered 2023-07-18: 50 mg via ORAL
  Filled 2023-07-18: qty 2

## 2023-07-18 MED ORDER — PREDNISONE 20 MG PO TABS: 40.0000 mg | ORAL_TABLET | Freq: Every day | ORAL | 0 refills | Status: AC

## 2023-07-18 NOTE — ED Notes (Signed)
RT Note: Patient stated he didn't feel like he needed or wanted a breathing treatment at this time. States his face feels swollen but he is still breathing ok. Patient encouraged to let me know if anything changes.

## 2023-07-18 NOTE — Discharge Instructions (Addendum)
Continue to take Benadryl, you can take 50 mg every 4-6 hours for rash or itching.  You are also being prescribed steroids, you were given the first dose here in the emergency room, start the next dose tomorrow.  If you develop new or worsening symptoms or any other concerns then return to the ER.  Otherwise follow-up with your primary care physician and/your allergist.

## 2023-07-18 NOTE — ED Provider Notes (Signed)
[facility] Provider Note   CSN: [id number] Arrival date & time: 07/18/23  1735     History  Chief Complaint  Patient presents with   Allergic Reaction    Rodney Harris is a 47 y.o. male.  HPI 47 year old male presents with bee stings.  He is allergic to bees.  However one of his hobbies is to send a beehive and he was doing this yesterday evening and states he got stung about 13 times.  Mostly on the arms and legs.  Started having some swelling and itching last night.  Symptoms are worse today.  Today he feels like his right face is a little swollen and his right nose seems to be a little clogged.  However he denies trouble breathing or swallowing or throat closing sensation.  He denies any vomiting or diarrhea or abdominal pain.  He noticed today that his right forearm was more swollen.  He took Benadryl once last night (1 tablet) and then 1 more tablet this morning around 10 AM.  Home Medications Prior to Admission medications   Medication Sig Start Date End Date Taking? Authorizing Provider  predniSONE (DELTASONE) 20 MG tablet Take 2 tablets (40 mg total) by mouth daily for 4 days. 07/18/23 07/22/23 Yes Pricilla Loveless, MD  albuterol (PROAIR HFA) 108 (90 Base) MCG/ACT inhaler Inhale 2 puffs into the lungs every 6 (six) hours as needed for wheezing or shortness of breath. 10/07/22   Copland, Gwenlyn Found, MD  EPINEPHrine 0.3 mg/0.3 mL IJ SOAJ injection  08/29/18   [provider]  esomeprazole (NEXIUM) 40 MG capsule Take 40 mg by mouth daily. 02/19/16   [provider]  Multiple Vitamin (MULTIVITAMIN) tablet Take 1 tablet by mouth daily.    [provider]      Allergies    Bee venom, Molds & smuts, Pollen extract, and Sulfa antibiotics    Review of Systems   Review of Systems  HENT:  Negative for sore throat, trouble swallowing and voice change.   Respiratory:  Negative for shortness of breath.   Skin:   Positive for rash.    Physical Exam Updated Vital Signs BP 130/86   Pulse 72   Temp 97.9 F (36.6 C) (Oral)   Resp 20   Ht 6\' 1"  (1.854 m)   Wt 96.6 kg   SpO2 99%   BMI 28.10 kg/m  Physical Exam Vitals and nursing note reviewed.  Constitutional:      General: He is not in acute distress.    Appearance: He is well-developed. He is not ill-appearing or diaphoretic.  HENT:     Head: Normocephalic and atraumatic.     Comments: I don't appreciate any significant facial swelling.    Mouth/Throat:     Pharynx: Oropharynx is clear.     Comments: No oropharyngeal swelling Cardiovascular:     Rate and Rhythm: Normal rate and regular rhythm.     Pulses:          Radial pulses are 2+ on the right side.     Heart sounds: Normal heart sounds.  Pulmonary:     Effort: Pulmonary effort is normal.     Breath sounds: Normal breath sounds. No stridor. No wheezing.  Abdominal:     Palpations: Abdomen is soft.     Tenderness: There is no abdominal tenderness.  Skin:    General: Skin is warm and dry.     Findings: Rash present.  Comments: There are multiple areas of exaggerated erythema from bee stings on both thighs. Right volar forearm also has erythema from exaggerated reaction.  Neurological:     Mental Status: He is alert.     ED Results / Procedures / Treatments   Labs (all labs ordered are listed, but only abnormal results are displayed) Labs Reviewed - No data to display  EKG None  Radiology No results found.  Procedures Procedures    Medications Ordered in ED Medications  diphenhydrAMINE (BENADRYL) capsule 50 mg (50 mg Oral Given 07/18/23 1819)  predniSONE (DELTASONE) tablet 60 mg (60 mg Oral Given 07/18/23 1818)    ED Course/ Medical Decision Making/ A&P                                 Medical Decision Making Risk Prescription drug management.   Patient presents with allergic reactions from bee stings.  No systemic signs/symptoms.  I do not think this  is of anaphylaxis.  Discussed options with patient, he prefers to start with oral Benadryl and steroids and see how he does.  He is feeling better after being observed after the oral Benadryl and prednisone.  I do think he was underdosing himself with Benadryl.  Given his improvement, he would like to continue these at home and will discharge with a steroid burst and continue Benadryl at home.  Discussed return precautions but otherwise this appears to be local exaggerated reactions to the bee stings.        Final Clinical Impression(s) / ED Diagnoses Final diagnoses:  Bee sting reaction, accidental or unintentional, initial encounter    Rx / DC Orders ED Discharge Orders          Ordered    predniSONE (DELTASONE) 20 MG tablet  Daily        07/18/23 1923              Pricilla Loveless, MD 07/18/23 2317

## 2023-07-18 NOTE — ED Notes (Signed)
Patient reports he is feeling better, no facial/throat swelling, no urticaria noted.

## 2023-07-18 NOTE — ED Triage Notes (Signed)
Pt  here from home with c/o  13 bee stings last night , has an allergy to bees and has an epi pen but has not used in 10years

## 2023-07-19 ENCOUNTER — Other Ambulatory Visit: Payer: Self-pay | Admitting: Family Medicine

## 2023-07-20 MED ORDER — EPINEPHRINE 0.3 MG/0.3ML IJ SOAJ: 0.3000 mg | INTRAMUSCULAR | 99 refills | Status: AC | PRN

## 2024-01-30 ENCOUNTER — Telehealth: Payer: Self-pay

## 2024-01-30 DIAGNOSIS — B9689 Other specified bacterial agents as the cause of diseases classified elsewhere: Secondary | ICD-10-CM

## 2024-01-30 DIAGNOSIS — J208 Acute bronchitis due to other specified organisms: Secondary | ICD-10-CM | POA: Diagnosis not present

## 2024-01-31 MED ORDER — AZITHROMYCIN 250 MG PO TABS
ORAL_TABLET | ORAL | 0 refills | Status: AC
Start: 2024-01-31 — End: 2024-02-05

## 2024-01-31 MED ORDER — BENZONATATE 100 MG PO CAPS
100.0000 mg | ORAL_CAPSULE | Freq: Three times a day (TID) | ORAL | 0 refills | Status: AC | PRN
Start: 2024-01-31 — End: ?

## 2024-01-31 NOTE — Progress Notes (Signed)

## 2024-06-11 NOTE — Progress Notes (Unsigned)
 [facility] [address] [place], [place] [address] [phone]  Date:  06/14/2024   Name:  Rodney Harris   DOB:  11-Feb-1976   MRN:  [id number]  PCP:  Watt Harlene BROCKS, MD    Chief Complaint: No chief complaint on file.   History of Present Illness:  Rodney Harris is a 48 y.o. very pleasant male patient who presents with the following:  Today for physical exam.  Most recent visit with myself was in November 2023  Generally in good health, history of GERD Married to Anahi-they have 3 children ages 68, 7,  He enjoys cycling for exercise  At our last visit we noted iron deficiency with a ferritin of 4.4.;  Patient noted he had recently gone on donates blood regularly.  We got FOBT which was negative.  We also did get a consultation with otology, most recent visit about 1 year ago: Impression and Plan: 1. Iron deficiency anemia, unspecified iron deficiency anemia type His IDA still appears secondary to excessive blood donation and low iron diet. Recent labs show continued improvement of iron studies. Ferritin has increased from 4.4 to 27, iron saturation is now 36%. Normal TIBC. Normal Hgb of 15.8 and normal MCV. Normal Platelets and WBCs.  He will continue his iron rich diet. He will take an iron supplement 2-4 times weekly. He will follow up in 6 months with labs.  Disposition: RTC 6 months labs only ( CBC w/, Iron, ferritin) RTC 6 months + 3-7 days APP  Colon cancer screening is due Tetanus shot is due Can update labs today-he has already started PSA screening Patient Active Problem List   Diagnosis Date Noted   Bronchitis with bronchospasm 03/02/2023   GERD (gastroesophageal reflux disease) 07/13/2016    Past Medical History:  Diagnosis Date   Acid reflux    Hernia     Past Surgical History:  Procedure Laterality Date   HERNIA REPAIR     1982   HERNIA REPAIR     2011   WISDOM TOOTH  EXTRACTION      Social History   Tobacco Use   Smoking status: Former    Current packs/day: 0.00    Types: Cigarettes    Quit date: 12/07/2013    Years since quitting: 10.5   Smokeless tobacco: Never  Vaping Use   Vaping status: Never Used  Substance Use Topics   Alcohol use: Yes    Comment: occassionally   Drug use: No    Family History  Problem Relation Age of Onset   Breast cancer Mother    Hypertension Father    Arthritis Maternal Grandmother    Hypertension Maternal Grandmother    Arthritis Maternal Grandfather    Lung cancer Maternal Grandfather    Hypertension Maternal Grandfather    Alcoholism Paternal Grandmother    Arthritis Paternal Grandmother    Breast cancer Paternal Grandmother    Hypertension Paternal Grandmother    Alcoholism Paternal Grandfather    Arthritis Paternal Grandfather    Stroke Paternal Grandfather    Hypertension Paternal Grandfather    Diabetes Paternal Grandfather    Colon cancer Neg Hx    Esophageal cancer Neg Hx    Stomach cancer Neg Hx    Rectal cancer Neg Hx     Allergies  Allergen Reactions   Bee Venom Anaphylaxis   Molds & Smuts Other (See Comments)    A bad sinus infection  Pollen Extract Other (See Comments)    A bad sinus infection   Sulfa Antibiotics Hives    Medication list has been reviewed and updated.  Current Outpatient Medications on File Prior to Visit  Medication Sig Dispense Refill   albuterol  (PROAIR  HFA) 108 (90 Base) MCG/ACT inhaler Inhale 2 puffs into the lungs every 6 (six) hours as needed for wheezing or shortness of breath. 16 each 0   benzonatate  (TESSALON ) 100 MG capsule Take 1-2 capsules (100-200 mg total) by mouth 3 (three) times daily as needed. 30 capsule 0   EPINEPHrine  0.3 mg/0.3 mL IJ SOAJ injection Inject 0.3 mg into the muscle as needed for anaphylaxis. 1 each 99   esomeprazole  (NEXIUM ) 40 MG capsule Take 40 mg by mouth daily.     Multiple Vitamin (MULTIVITAMIN) tablet Take 1 tablet by  mouth daily.     No current facility-administered medications on file prior to visit.    Review of Systems:  As per HPI- otherwise negative.   Physical Examination: There were no vitals filed for this visit. There were no vitals filed for this visit. There is no height or weight on file to calculate BMI. Ideal Body Weight:    GEN: no acute distress. HEENT: Atraumatic, Normocephalic.  Ears and Nose: No external deformity. CV: RRR, No M/G/R. No JVD. No thrill. No extra heart sounds. PULM: CTA B, no wheezes, crackles, rhonchi. No retractions. No resp. distress. No accessory muscle use. ABD: S, NT, ND, +BS. No rebound. No HSM. EXTR: No c/c/e PSYCH: Normally interactive. Conversant.    Assessment and Plan: *** Physical exam today.  Encouraged healthy diet and exercise routine Will plan further follow- up pending labs.  Signed Harlene Schroeder, MD

## 2024-06-11 NOTE — Patient Instructions (Signed)
 It was great to see you again today, I will be in touch with your blood work

## 2024-06-14 ENCOUNTER — Ambulatory Visit (INDEPENDENT_AMBULATORY_CARE_PROVIDER_SITE_OTHER): Admitting: Family Medicine

## 2024-06-14 ENCOUNTER — Encounter: Payer: Self-pay | Admitting: Family Medicine

## 2024-06-14 VITALS — BP 128/74 | HR 82 | Temp 98.0°F | Ht 73.0 in | Wt 214.0 lb

## 2024-06-14 DIAGNOSIS — Z0000 Encounter for general adult medical examination without abnormal findings: Secondary | ICD-10-CM | POA: Diagnosis not present

## 2024-06-14 DIAGNOSIS — Z13220 Encounter for screening for lipoid disorders: Secondary | ICD-10-CM | POA: Diagnosis not present

## 2024-06-14 DIAGNOSIS — Z23 Encounter for immunization: Secondary | ICD-10-CM | POA: Diagnosis not present

## 2024-06-14 DIAGNOSIS — Z131 Encounter for screening for diabetes mellitus: Secondary | ICD-10-CM | POA: Diagnosis not present

## 2024-06-14 DIAGNOSIS — M79672 Pain in left foot: Secondary | ICD-10-CM

## 2024-06-14 DIAGNOSIS — Z91030 Bee allergy status: Secondary | ICD-10-CM

## 2024-06-14 DIAGNOSIS — Z125 Encounter for screening for malignant neoplasm of prostate: Secondary | ICD-10-CM | POA: Diagnosis not present

## 2024-06-14 DIAGNOSIS — Z1211 Encounter for screening for malignant neoplasm of colon: Secondary | ICD-10-CM

## 2024-06-14 DIAGNOSIS — M79671 Pain in right foot: Secondary | ICD-10-CM

## 2024-06-14 DIAGNOSIS — D500 Iron deficiency anemia secondary to blood loss (chronic): Secondary | ICD-10-CM | POA: Diagnosis not present

## 2024-06-14 LAB — CBC
HCT: 45.7 % (ref 39.0–52.0)
Hemoglobin: 15.6 g/dL (ref 13.0–17.0)
MCHC: 34.1 g/dL (ref 30.0–36.0)
MCV: 84.2 fl (ref 78.0–100.0)
Platelets: 210 K/uL (ref 150.0–400.0)
RBC: 5.43 Mil/uL (ref 4.22–5.81)
RDW: 13 % (ref 11.5–15.5)
WBC: 4.5 K/uL (ref 4.0–10.5)

## 2024-06-14 LAB — COMPREHENSIVE METABOLIC PANEL WITH GFR
ALT: 32 U/L (ref 0–53)
AST: 28 U/L (ref 0–37)
Albumin: 4.5 g/dL (ref 3.5–5.2)
Alkaline Phosphatase: 51 U/L (ref 39–117)
BUN: 15 mg/dL (ref 6–23)
CO2: 28 meq/L (ref 19–32)
Calcium: 9 mg/dL (ref 8.4–10.5)
Chloride: 104 meq/L (ref 96–112)
Creatinine, Ser: 1.02 mg/dL (ref 0.40–1.50)
GFR: 86.04 mL/min (ref >60.00)
Glucose, Bld: 96 mg/dL (ref 70–99)
Potassium: 3.9 meq/L (ref 3.5–5.1)
Sodium: 138 meq/L (ref 135–145)
Total Bilirubin: 0.8 mg/dL (ref 0.2–1.2)
Total Protein: 6.9 g/dL (ref 6.0–8.3)

## 2024-06-14 LAB — LIPID PANEL
Cholesterol: 231 mg/dL — ABNORMAL HIGH (ref 0–200)
HDL: 57 mg/dL (ref >39.00)
LDL Cholesterol: 153 mg/dL — ABNORMAL HIGH (ref 0–99)
NonHDL: 174.42
Total CHOL/HDL Ratio: 4
Triglycerides: 105 mg/dL (ref 0.0–149.0)
VLDL: 21 mg/dL (ref 0.0–40.0)

## 2024-06-14 LAB — HEMOGLOBIN A1C: Hgb A1c MFr Bld: 5.6 % (ref 4.6–6.5)

## 2024-06-14 LAB — PSA: PSA: 0.76 ng/mL (ref 0.10–4.00)

## 2024-06-14 MED ORDER — EPINEPHRINE 0.3 MG/0.3ML IJ SOAJ
0.3000 mg | INTRAMUSCULAR | 99 refills | Status: AC | PRN
Start: 2024-06-14 — End: ?

## 2024-06-15 ENCOUNTER — Encounter: Payer: Self-pay | Admitting: Family Medicine

## 2024-06-15 LAB — IRON,TIBC AND FERRITIN PANEL
%SAT: 38 % (ref 20–48)
Ferritin: 57 ng/mL (ref 38–380)
Iron: 116 ug/dL (ref 50–180)
TIBC: 306 ug/dL (ref 250–425)

## 2024-06-27 ENCOUNTER — Ambulatory Visit: Admitting: Sports Medicine

## 2024-06-30 LAB — COLOGUARD: COLOGUARD: NEGATIVE

## 2024-07-01 ENCOUNTER — Encounter: Payer: Self-pay | Admitting: Family Medicine
